# Patient Record
Sex: Male | Born: 1985 | Race: Black or African American | Hispanic: No | Smoking: Never smoker
Health system: Southern US, Community
[De-identification: ages and names within clinical notes are randomized; demographics above are authoritative.]

---
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
## 2008-10-29 ENCOUNTER — Emergency Department (HOSPITAL_COMMUNITY): Admission: EM | Admit: 2008-10-29 | Discharge: 2008-10-29 | Payer: Self-pay | Admitting: Emergency Medicine

## 2008-12-19 ENCOUNTER — Emergency Department (HOSPITAL_COMMUNITY): Admission: EM | Admit: 2008-12-19 | Discharge: 2008-12-19 | Payer: Self-pay | Admitting: Emergency Medicine

## 2010-07-08 LAB — GC/CHLAMYDIA PROBE AMP, GENITAL: GC Probe Amp, Genital: NEGATIVE

## 2010-07-26 IMAGING — CR DG ANKLE COMPLETE 3+V*L*
3 series · 3 of 3 positions shown · non-contrast
Comparison: None

CLINICAL DATA: Injured ankle playing basketball

LEFT ANKLE COMPLETE - 3+ VIEW

[view not recorded (1 of 3)]
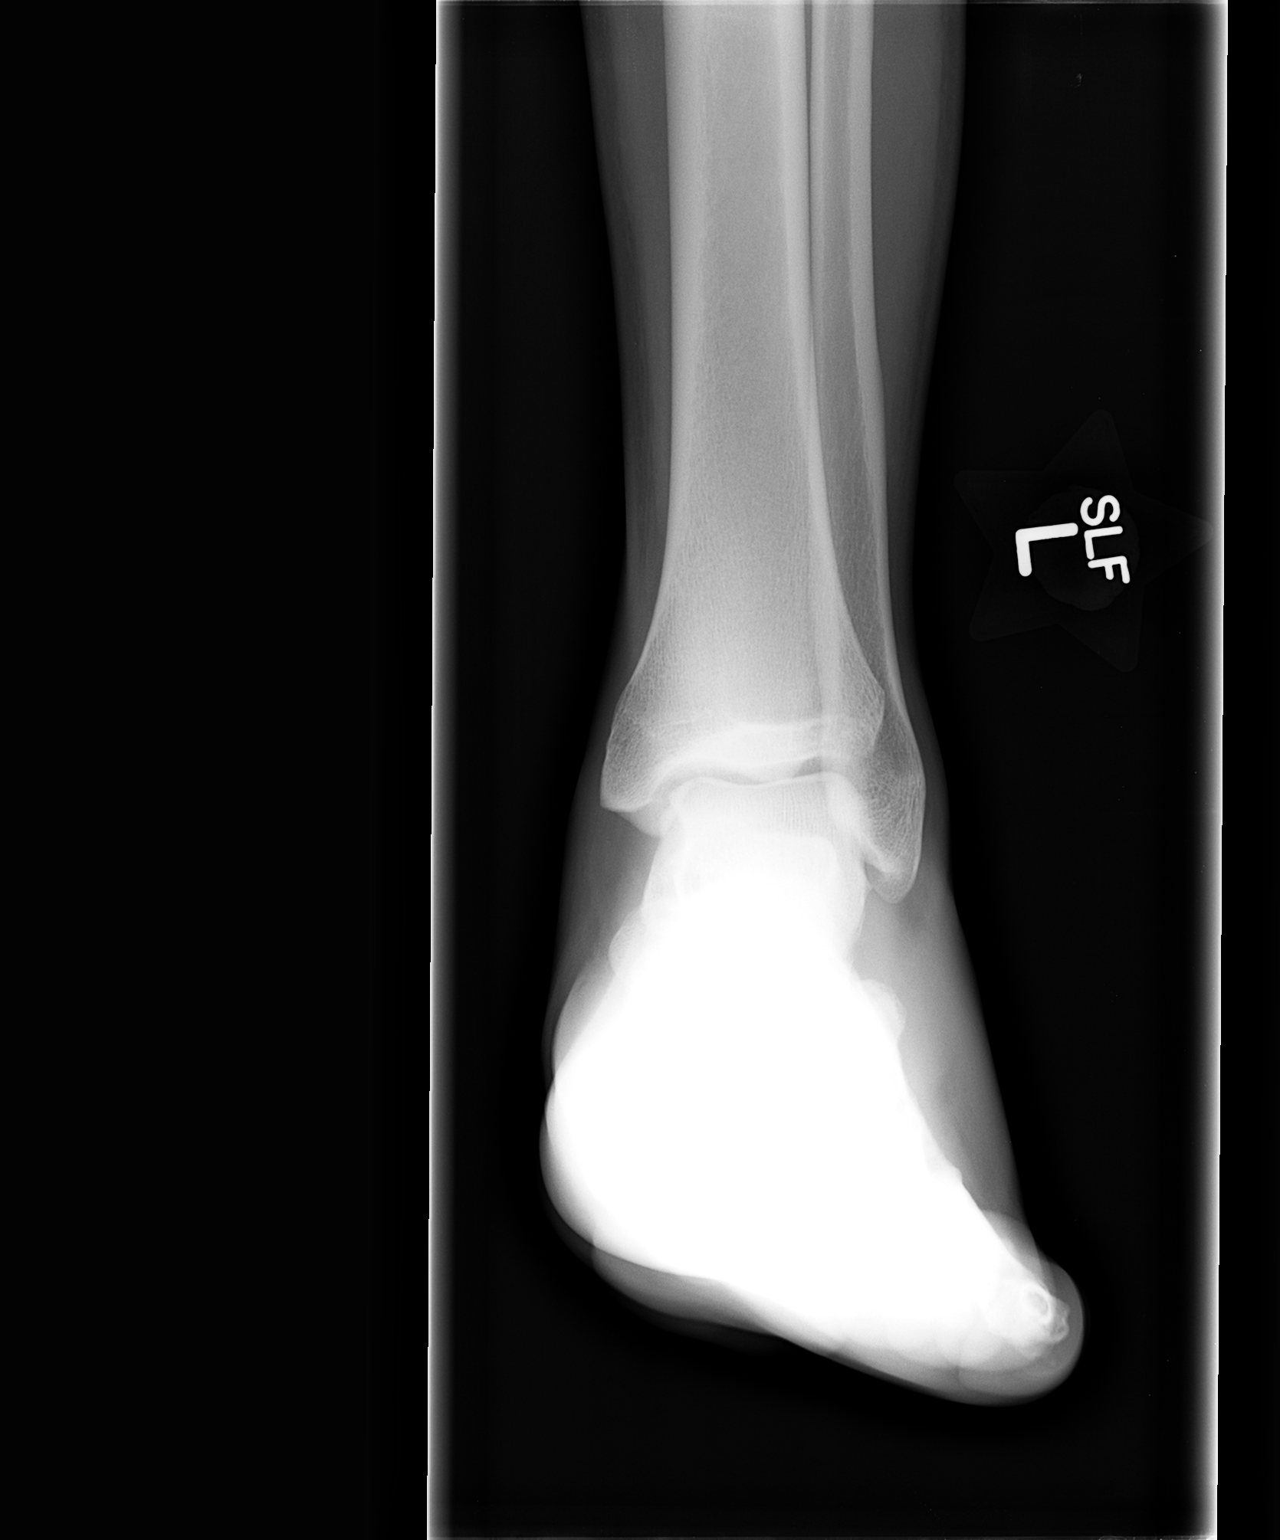

[view not recorded (2 of 3)]
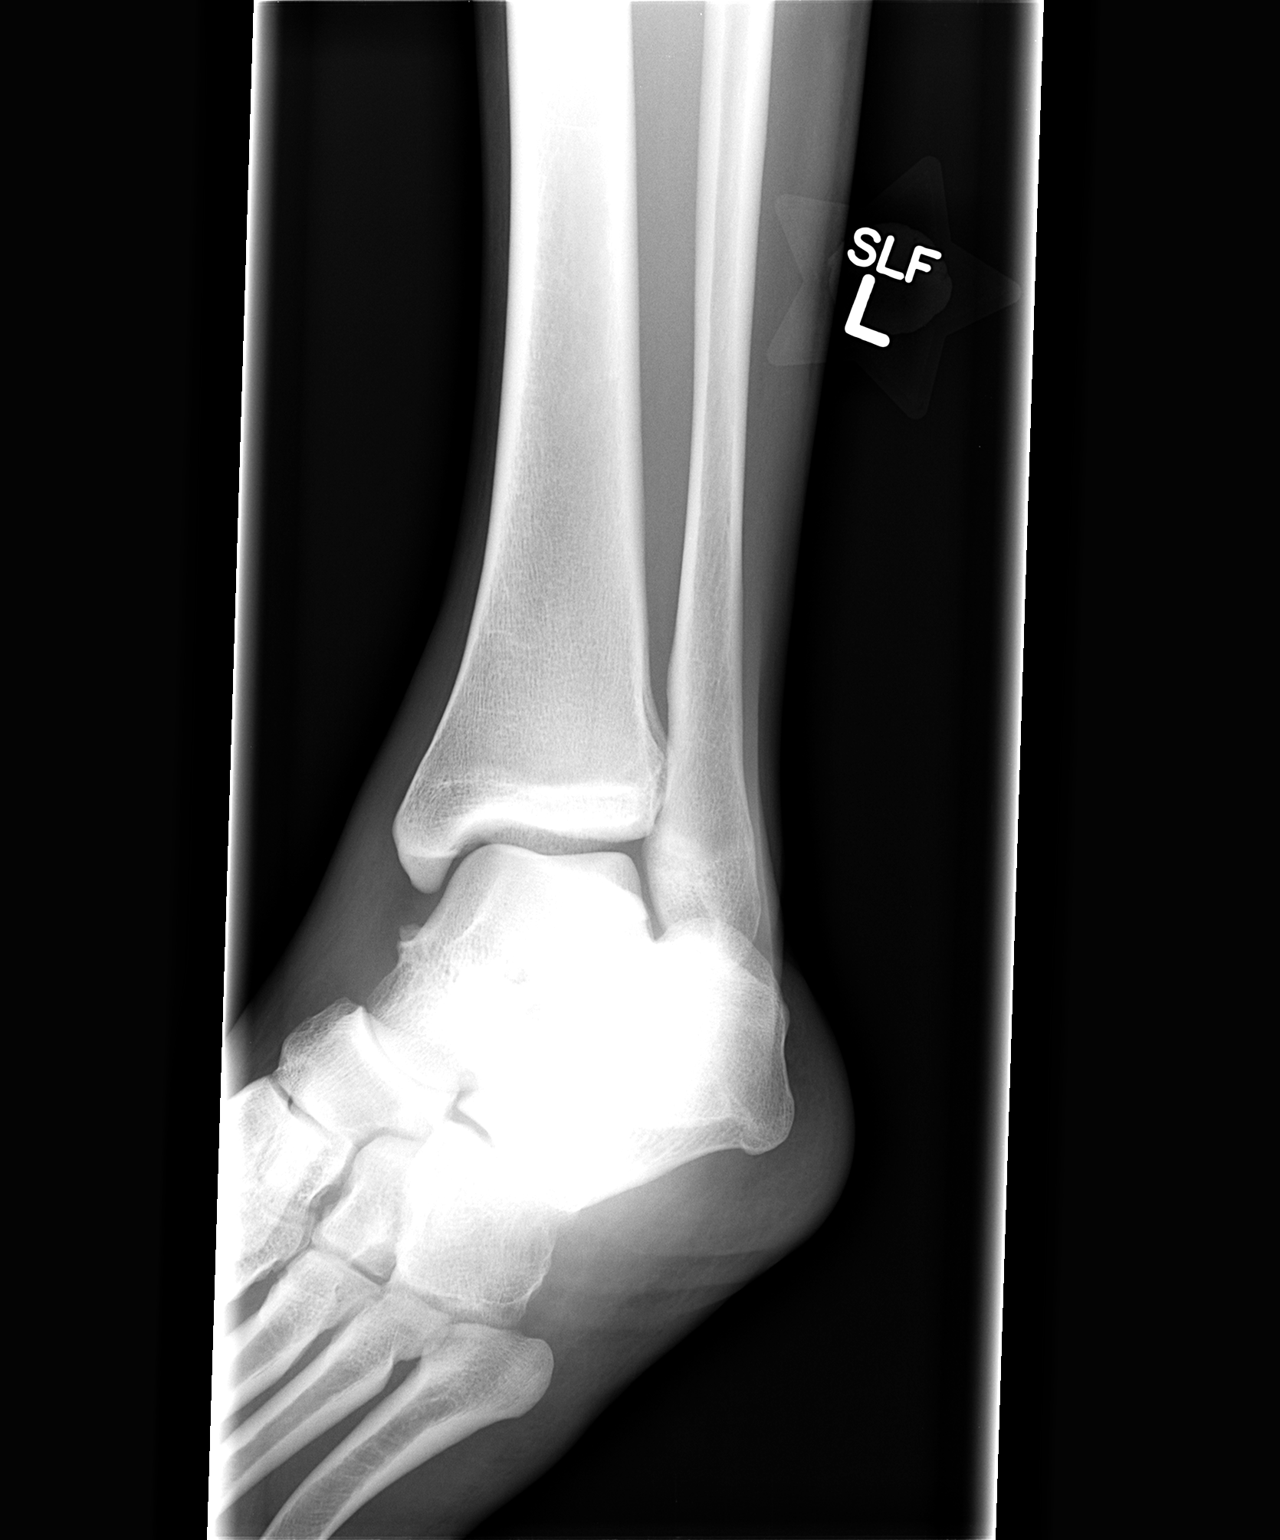

[view not recorded (3 of 3)]
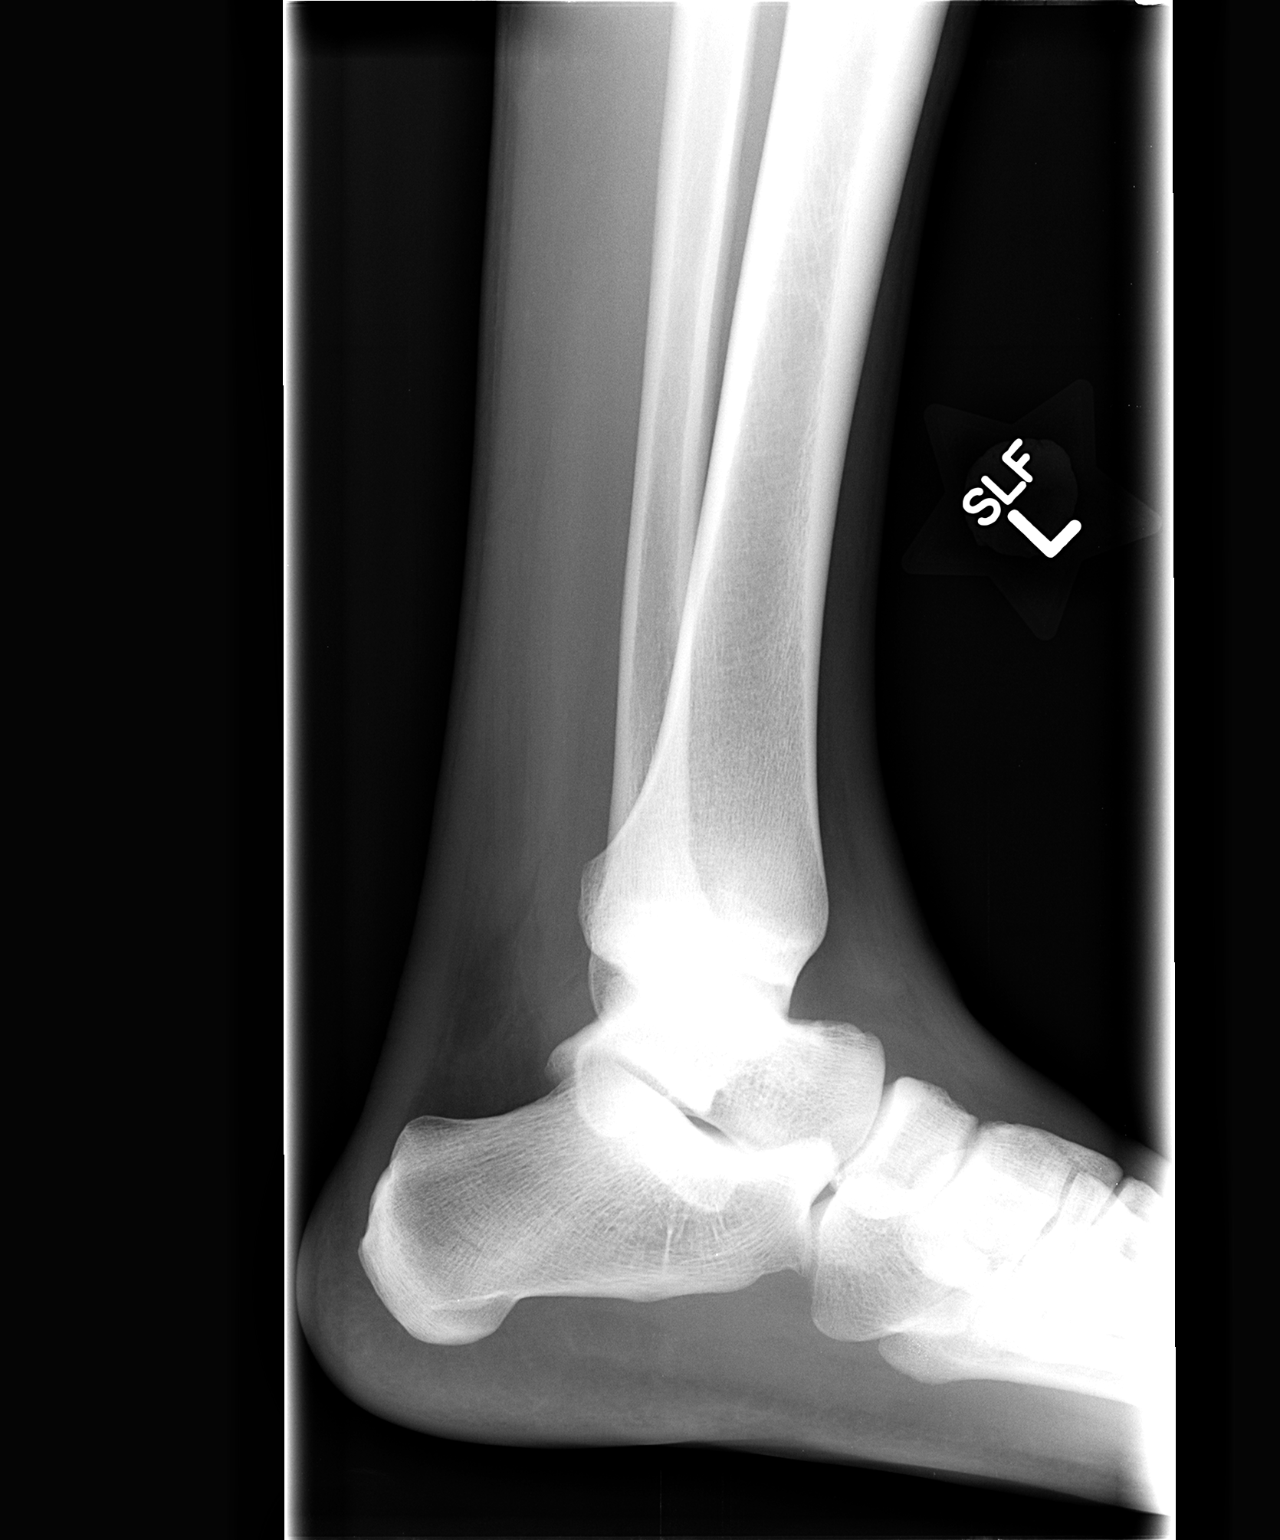

[3 of 3 positions shown; findings below may reference images not displayed]

FINDINGS: Ankle joint appears normal.  No acute fracture is seen.
Alignment is normal.
IMPRESSION: Negative left ankle.

## 2014-04-05 ENCOUNTER — Emergency Department (INDEPENDENT_AMBULATORY_CARE_PROVIDER_SITE_OTHER)
Admission: EM | Admit: 2014-04-05 | Discharge: 2014-04-05 | Disposition: A | Discharge status: Home / Self Care | Payer: Worker's Compensation | Source: Home / Self Care | Attending: Family Medicine | Admitting: Family Medicine

## 2014-04-05 ENCOUNTER — Encounter (HOSPITAL_COMMUNITY): Payer: Self-pay | Admitting: Emergency Medicine

## 2014-04-05 DIAGNOSIS — S4991XA Unspecified injury of right shoulder and upper arm, initial encounter: Secondary | ICD-10-CM

## 2014-04-05 MED ORDER — DICLOFENAC SODIUM 75 MG PO TBEC: 75.0000 mg | DELAYED_RELEASE_TABLET | Freq: Two times a day (BID) | ORAL | Status: DC | PRN

## 2014-04-05 NOTE — Discharge Instructions (Signed)
Thank you for coming in today. Follow-up at [facility] or orthopedics of choice for [employer]. Claims Take diclofenac twice daily as needed  Impingement Syndrome, Rotator Cuff, Bursitis with Rehab Impingement syndrome is a condition that involves inflammation of the tendons of the rotator cuff and the subacromial bursa, that causes pain in the shoulder. The rotator cuff consists of four tendons and muscles that control much of the shoulder and upper arm function. The subacromial bursa is a fluid filled sac that helps reduce friction between the rotator cuff and one of the bones of the shoulder (acromion). Impingement syndrome is usually an overuse injury that causes swelling of the bursa (bursitis), swelling of the tendon (tendonitis), and/or a tear of the tendon (strain). Strains are classified into three categories. Grade 1 strains cause pain, but the tendon is not lengthened. Grade 2 strains include a lengthened ligament, due to the ligament being stretched or partially ruptured. With grade 2 strains there is still function, although the function may be decreased. Grade 3 strains include a complete tear of the tendon or muscle, and function is usually impaired. SYMPTOMS   Pain around the shoulder, often at the outer portion of the upper arm.  Pain that gets worse with shoulder function, especially when reaching overhead or lifting.  Sometimes, aching when not using the arm.  Pain that wakes you up at night.  Sometimes, tenderness, swelling, warmth, or redness over the affected area.  Loss of strength.  Limited motion of the shoulder, especially reaching behind the back (to the back pocket or to unhook bra) or across your body.  Crackling sound (crepitation) when moving the arm.  Biceps tendon pain and inflammation (in the front of the shoulder). Worse when bending the elbow or lifting. CAUSES  Impingement syndrome is often an overuse injury, in which chronic  (repetitive) motions cause the tendons or bursa to become inflamed. A strain occurs when a force is paced on the tendon or muscle that is greater than it can withstand. Common mechanisms of injury include: Stress from sudden increase in duration, frequency, or intensity of training.  Direct hit (trauma) to the shoulder.  Aging, erosion of the tendon with normal use.  Bony bump on shoulder (acromial spur). RISK INCREASES WITH:  Contact sports (football, wrestling, boxing).  Throwing sports (baseball, tennis, volleyball).  Weightlifting and bodybuilding.  Heavy labor.  Previous injury to the rotator cuff, including impingement.  Poor shoulder strength and flexibility.  Failure to warm up properly before activity.  Inadequate protective equipment.  Old age.  Bony bump on shoulder (acromial spur). PREVENTION   Warm up and stretch properly before activity.  Allow for adequate recovery between workouts.  Maintain physical fitness:  Strength, flexibility, and endurance.  Cardiovascular fitness.  Learn and use proper exercise technique. PROGNOSIS  If treated properly, impingement syndrome usually goes away within 6 weeks. Sometimes surgery is required.  RELATED COMPLICATIONS   Longer healing time if not properly treated, or if not given enough time to heal.  Recurring symptoms, that result in a chronic condition.  Shoulder stiffness, frozen shoulder, or loss of motion.  Rotator cuff tendon tear.  Recurring symptoms, especially if activity is resumed too soon, with overuse, with a direct blow, or when using poor technique. TREATMENT  Treatment first involves the use of ice and medicine, to reduce pain and inflammation. The use of strengthening and stretching exercises may help reduce pain with activity. These exercises may be performed at home or with a  therapist. If non-surgical treatment is unsuccessful after more than 6 months, surgery may be advised. After surgery  and rehabilitation, activity is usually possible in 3 months.  MEDICATION  If pain medicine is needed, nonsteroidal anti-inflammatory medicines (aspirin and ibuprofen), or other minor pain relievers (acetaminophen), are often advised.  Do not take pain medicine for 7 days before surgery.  Prescription pain relievers may be given, if your caregiver thinks they are needed. Use only as directed and only as much as you need.  Corticosteroid injections may be given by your caregiver. These injections should be reserved for the most serious cases, because they may only be given a certain number of times. HEAT AND COLD  Cold treatment (icing) should be applied for 10 to 15 minutes every 2 to 3 hours for inflammation and pain, and immediately after activity that aggravates your symptoms. Use ice packs or an ice massage.  Heat treatment may be used before performing stretching and strengthening activities prescribed by your caregiver, physical therapist, or athletic trainer. Use a heat pack or a warm water soak. SEEK MEDICAL CARE IF:   Symptoms get worse or do not improve in 4 to 6 weeks, despite treatment.  New, unexplained symptoms develop. (Drugs used in treatment may produce side effects.) EXERCISES  RANGE OF MOTION (ROM) AND STRETCHING EXERCISES - Impingement Syndrome (Rotator Cuff  Tendinitis, Bursitis) These exercises may help you when beginning to rehabilitate your injury. Your symptoms may go away with or without further involvement from your physician, physical therapist or athletic trainer. While completing these exercises, remember:   Restoring tissue flexibility helps normal motion to return to the joints. This allows healthier, less painful movement and activity.  An effective stretch should be held for at least 30 seconds.  A stretch should never be painful. You should only feel a gentle lengthening or release in the stretched tissue. STRETCH - Flexion, Standing  Stand with good  posture. With an underhand grip on your right / left hand, and an overhand grip on the opposite hand, grasp a broomstick or cane so that your hands are a little more than shoulder width apart.  Keeping your right / left elbow straight and shoulder muscles relaxed, push the stick with your opposite hand, to raise your right / left arm in front of your body and then overhead. Raise your arm until you feel a stretch in your right / left shoulder, but before you have increased shoulder pain.  Try to avoid shrugging your right / left shoulder as your arm rises, by keeping your shoulder blade tucked down and toward your mid-back spine. Hold for __________ seconds.  Slowly return to the starting position. Repeat __________ times. Complete this exercise __________ times per day. STRETCH - Abduction, Supine  Lie on your back. With an underhand grip on your right / left hand and an overhand grip on the opposite hand, grasp a broomstick or cane so that your hands are a little more than shoulder width apart.  Keeping your right / left elbow straight and your shoulder muscles relaxed, push the stick with your opposite hand, to raise your right / left arm out to the side of your body and then overhead. Raise your arm until you feel a stretch in your right / left shoulder, but before you have increased shoulder pain.  Try to avoid shrugging your right / left shoulder as your arm rises, by keeping your shoulder blade tucked down and toward your mid-back spine. Hold for __________  seconds.  Slowly return to the starting position. Repeat __________ times. Complete this exercise __________ times per day. ROM - Flexion, Active-Assisted  Lie on your back. You may bend your knees for comfort.  Grasp a broomstick or cane so your hands are about shoulder width apart. Your right / left hand should grip the end of the stick, so that your hand is positioned "thumbs-up," as if you were about to shake hands.  Using your  healthy arm to lead, raise your right / left arm overhead, until you feel a gentle stretch in your shoulder. Hold for __________ seconds.  Use the stick to assist in returning your right / left arm to its starting position. Repeat __________ times. Complete this exercise __________ times per day.  ROM - Internal Rotation, Supine   Lie on your back on a firm surface. Place your right / left elbow about 60 degrees away from your side. Elevate your elbow with a folded towel, so that the elbow and shoulder are the same height.  Using a broomstick or cane and your strong arm, pull your right / left hand toward your body until you feel a gentle stretch, but no increase in your shoulder pain. Keep your shoulder and elbow in place throughout the exercise.  Hold for __________ seconds. Slowly return to the starting position. Repeat __________ times. Complete this exercise __________ times per day. STRETCH - Internal Rotation  Place your right / left hand behind your back, palm up.  Throw a towel or belt over your opposite shoulder. Grasp the towel with your right / left hand.  While keeping an upright posture, gently pull up on the towel, until you feel a stretch in the front of your right / left shoulder.  Avoid shrugging your right / left shoulder as your arm rises, by keeping your shoulder blade tucked down and toward your mid-back spine.  Hold for __________ seconds. Release the stretch, by lowering your healthy hand. Repeat __________ times. Complete this exercise __________ times per day. ROM - Internal Rotation   Using an underhand grip, grasp a stick behind your back with both hands.  While standing upright with good posture, slide the stick up your back until you feel a mild stretch in the front of your shoulder.  Hold for __________ seconds. Slowly return to your starting position. Repeat __________ times. Complete this exercise __________ times per day.  STRETCH - Posterior Shoulder  Capsule   Stand or sit with good posture. Grasp your right / left elbow and draw it across your chest, keeping it at the same height as your shoulder.  Pull your elbow, so your upper arm comes in closer to your chest. Pull until you feel a gentle stretch in the back of your shoulder.  Hold for __________ seconds. Repeat __________ times. Complete this exercise __________ times per day. STRENGTHENING EXERCISES - Impingement Syndrome (Rotator Cuff Tendinitis, Bursitis) These exercises may help you when beginning to rehabilitate your injury. They may resolve your symptoms with or without further involvement from your physician, physical therapist or athletic trainer. While completing these exercises, remember:  Muscles can gain both the endurance and the strength needed for everyday activities through controlled exercises.  Complete these exercises as instructed by your physician, physical therapist or athletic trainer. Increase the resistance and repetitions only as guided.  You may experience muscle soreness or fatigue, but the pain or discomfort you are trying to eliminate should never worsen during these exercises. If this pain does  get worse, stop and make sure you are following the directions exactly. If the pain is still present after adjustments, discontinue the exercise until you can discuss the trouble with your clinician.  During your recovery, avoid activity or exercises which involve actions that place your injured hand or elbow above your head or behind your back or head. These positions stress the tissues which you are trying to heal. STRENGTH - Scapular Depression and Adduction   With good posture, sit on a firm chair. Support your arms in front of you, with pillows, arm rests, or on a table top. Have your elbows in line with the sides of your body.  Gently draw your shoulder blades down and toward your mid-back spine. Gradually increase the tension, without tensing the muscles  along the top of your shoulders and the back of your neck.  Hold for __________ seconds. Slowly release the tension and relax your muscles completely before starting the next repetition.  After you have practiced this exercise, remove the arm support and complete the exercise in standing as well as sitting position. Repeat __________ times. Complete this exercise __________ times per day.  STRENGTH - Shoulder Abductors, Isometric  With good posture, stand or sit about 4-6 inches from a wall, with your right / left side facing the wall.  Bend your right / left elbow. Gently press your right / left elbow into the wall. Increase the pressure gradually, until you are pressing as hard as you can, without shrugging your shoulder or increasing any shoulder discomfort.  Hold for __________ seconds.  Release the tension slowly. Relax your shoulder muscles completely before you begin the next repetition. Repeat __________ times. Complete this exercise __________ times per day.  STRENGTH - External Rotators, Isometric  Keep your right / left elbow at your side and bend it 90 degrees.  Step into a door frame so that the outside of your right / left wrist can press against the door frame without your upper arm leaving your side.  Gently press your right / left wrist into the door frame, as if you were trying to swing the back of your hand away from your stomach. Gradually increase the tension, until you are pressing as hard as you can, without shrugging your shoulder or increasing any shoulder discomfort.  Hold for __________ seconds.  Release the tension slowly. Relax your shoulder muscles completely before you begin the next repetition. Repeat __________ times. Complete this exercise __________ times per day.  STRENGTH - Supraspinatus   Stand or sit with good posture. Grasp a __________ weight, or an exercise band or tubing, so that your hand is "thumbs-up," like you are shaking hands.  Slowly  lift your right / left arm in a "V" away from your thigh, diagonally into the space between your side and straight ahead. Lift your hand to shoulder height or as far as you can, without increasing any shoulder pain. At first, many people do not lift their hands above shoulder height.  Avoid shrugging your right / left shoulder as your arm rises, by keeping your shoulder blade tucked down and toward your mid-back spine.  Hold for __________ seconds. Control the descent of your hand, as you slowly return to your starting position. Repeat __________ times. Complete this exercise __________ times per day.  STRENGTH - External Rotators  Secure a rubber exercise band or tubing to a fixed object (table, pole) so that it is at the same height as your right / left elbow  when you are standing or sitting on a firm surface.  Stand or sit so that the secured exercise band is at your uninjured side.  Bend your right / left elbow 90 degrees. Place a folded towel or small pillow under your right / left arm, so that your elbow is a few inches away from your side.  Keeping the tension on the exercise band, pull it away from your body, as if pivoting on your elbow. Be sure to keep your body steady, so that the movement is coming only from your rotating shoulder.  Hold for __________ seconds. Release the tension in a controlled manner, as you return to the starting position. Repeat __________ times. Complete this exercise __________ times per day.  STRENGTH - Internal Rotators   Secure a rubber exercise band or tubing to a fixed object (table, pole) so that it is at the same height as your right / left elbow when you are standing or sitting on a firm surface.  Stand or sit so that the secured exercise band is at your right / left side.  Bend your elbow 90 degrees. Place a folded towel or small pillow under your right / left arm so that your elbow is a few inches away from your side.  Keeping the tension on the  exercise band, pull it across your body, toward your stomach. Be sure to keep your body steady, so that the movement is coming only from your rotating shoulder.  Hold for __________ seconds. Release the tension in a controlled manner, as you return to the starting position. Repeat __________ times. Complete this exercise __________ times per day.  STRENGTH - Scapular Protractors, Standing   Stand arms length away from a wall. Place your hands on the wall, keeping your elbows straight.  Begin by dropping your shoulder blades down and toward your mid-back spine.  To strengthen your protractors, keep your shoulder blades down, but slide them forward on your rib cage. It will feel as if you are lifting the back of your rib cage away from the wall. This is a subtle motion and can be challenging to complete. Ask your caregiver for further instruction, if you are not sure you are doing the exercise correctly.  Hold for __________ seconds. Slowly return to the starting position, resting the muscles completely before starting the next repetition. Repeat __________ times. Complete this exercise __________ times per day. STRENGTH - Scapular Protractors, Supine  Lie on your back on a firm surface. Extend your right / left arm straight into the air while holding a __________ weight in your hand.  Keeping your head and back in place, lift your shoulder off the floor.  Hold for __________ seconds. Slowly return to the starting position, and allow your muscles to relax completely before starting the next repetition. Repeat __________ times. Complete this exercise __________ times per day. STRENGTH - Scapular Protractors, Quadruped  Get onto your hands and knees, with your shoulders directly over your hands (or as close as you can be, comfortably).  Keeping your elbows locked, lift the back of your rib cage up into your shoulder blades, so your mid-back rounds out. Keep your neck muscles relaxed.  Hold  this position for __________ seconds. Slowly return to the starting position and allow your muscles to relax completely before starting the next repetition. Repeat __________ times. Complete this exercise __________ times per day.  STRENGTH - Scapular Retractors  Secure a rubber exercise band or tubing to a fixed object (  table, pole), so that it is at the height of your shoulders when you are either standing, or sitting on a firm armless chair.  With a palm down grip, grasp an end of the band in each hand. Straighten your elbows and lift your hands straight in front of you, at shoulder height. Step back, away from the secured end of the band, until it becomes tense.  Squeezing your shoulder blades together, draw your elbows back toward your sides, as you bend them. Keep your upper arms lifted away from your body throughout the exercise.  Hold for __________ seconds. Slowly ease the tension on the band, as you reverse the directions and return to the starting position. Repeat __________ times. Complete this exercise __________ times per day. STRENGTH - Shoulder Extensors   Secure a rubber exercise band or tubing to a fixed object (table, pole) so that it is at the height of your shoulders when you are either standing, or sitting on a firm armless chair.  With a thumbs-up grip, grasp an end of the band in each hand. Straighten your elbows and lift your hands straight in front of you, at shoulder height. Step back, away from the secured end of the band, until it becomes tense.  Squeezing your shoulder blades together, pull your hands down to the sides of your thighs. Do not allow your hands to go behind you.  Hold for __________ seconds. Slowly ease the tension on the band, as you reverse the directions and return to the starting position. Repeat __________ times. Complete this exercise __________ times per day.  STRENGTH - Scapular Retractors and External Rotators   Secure a rubber exercise  band or tubing to a fixed object (table, pole) so that it is at the height as your shoulders, when you are either standing, or sitting on a firm armless chair.  With a palm down grip, grasp an end of the band in each hand. Bend your elbows 90 degrees and lift your elbows to shoulder height, at your sides. Step back, away from the secured end of the band, until it becomes tense.  Squeezing your shoulder blades together, rotate your shoulders so that your upper arms and elbows remain stationary, but your fists travel upward to head height.  Hold for __________ seconds. Slowly ease the tension on the band, as you reverse the directions and return to the starting position. Repeat __________ times. Complete this exercise __________ times per day.  STRENGTH - Scapular Retractors and External Rotators, Rowing   Secure a rubber exercise band or tubing to a fixed object (table, pole) so that it is at the height of your shoulders, when you are either standing, or sitting on a firm armless chair.  With a palm down grip, grasp an end of the band in each hand. Straighten your elbows and lift your hands straight in front of you, at shoulder height. Step back, away from the secured end of the band, until it becomes tense.  Step 1: Squeeze your shoulder blades together. Bending your elbows, draw your hands to your chest, as if you are rowing a boat. At the end of this motion, your hands and elbow should be at shoulder height and your elbows should be out to your sides.  Step 2: Rotate your shoulders, to raise your hands above your head. Your forearms should be vertical and your upper arms should be horizontal.  Hold for __________ seconds. Slowly ease the tension on the band, as you reverse the  directions and return to the starting position. Repeat __________ times. Complete this exercise __________ times per day.  STRENGTH - Scapular Depressors  Find a sturdy chair without wheels, such as a dining room  chair.  Keeping your feet on the floor, and your hands on the chair arms, lift your bottom up from the seat, and lock your elbows.  Keeping your elbows straight, allow gravity to pull your body weight down. Your shoulders will rise toward your ears.  Raise your body against gravity by drawing your shoulder blades down your back, shortening the distance between your shoulders and ears. Although your feet should always maintain contact with the floor, your feet should progressively support less body weight, as you get stronger.  Hold for __________ seconds. In a controlled and slow manner, lower your body weight to begin the next repetition. Repeat __________ times. Complete this exercise __________ times per day.  Document Released: 03/20/2005 Document Revised: 06/12/2011 Document Reviewed: 07/02/2008 [facility] Patient Information 2015 [place], [place]. This information is not intended to replace advice given to you by your health care provider. Make sure you discuss any questions you have with your health care provider.

## 2014-04-05 NOTE — ED Provider Notes (Signed)
Jeffrey Parker is a 29 y.o. VYRON FRONCZAKho presents to Urgent Care today for right shoulder pain. He shouldn't developed right shoulder pain at work at [employer] 1-1/2-2 weeks ago. He felt a pulling shifting sensation in his shoulder while retrieving a heavy box. Since that his shoulder has felt somewhat unstable and painful. He denies any radiating pain weakness or numbness or fevers or chills. He's tried some over-the-counter medications which have not helped much.   History reviewed. No pertinent past medical history. History reviewed. No pertinent past surgical history. History  Substance Use Topics  . Smoking status: Never Smoker   . Smokeless tobacco: Not on file  . Alcohol Use: No   ROS as above Medications: No current facility-administered medications for this encounter.   Current Outpatient Prescriptions  Medication Sig Dispense Refill  . diclofenac (VOLTAREN) 75 MG EC tablet Take 1 tablet (75 mg total) by mouth 2 (two) times daily as needed. 60 tablet 0   No Known Allergies   Exam:  BP 121/67 mmHg  Pulse 55  Temp(Src) 97.7 F (36.5 C) (Oral)  Resp 12  SpO2 100% Gen: Well NAD Neck: Tender to spinal midline normal neck range of motion. Right shoulder normal-appearing nontender. Normal range of motion Negative impingement testing Positive O'Brien test. Positive apprehension and relocation test Strength is intact throughout left shoulder nontender normal motion negative impingement normal strength Pulses capillary Refill sensation are intact bilateral upper extremities  No results found for this or any previous visit (from the past 24 hour(s)). No results found.  Assessment and Plan: 29 y.o. male with suspect labrum injury. Treatment with diclofenac relative rest home exercise program and follow-up with orthopedics  Discussed warning signs or symptoms. Please see discharge instructions. Patient expresses understanding.     Rodolph Bong, MD 04/05/14 [phone]

## 2014-04-05 NOTE — ED Notes (Signed)
Patient c/o shoulder painx 2 weeks. Patient reports he does a lot of heavy lifting at work. Reports pain feels worse after sleeping and gradually gets better during the day. Patient is in NAD.

## 2019-01-01 ENCOUNTER — Other Ambulatory Visit: Payer: Self-pay

## 2019-01-01 ENCOUNTER — Ambulatory Visit (HOSPITAL_COMMUNITY)
Admission: EM | Admit: 2019-01-01 | Discharge: 2019-01-01 | Disposition: A | Discharge status: Home / Self Care | Payer: HRSA Program | Attending: Family Medicine | Admitting: Family Medicine

## 2019-01-01 ENCOUNTER — Encounter (HOSPITAL_COMMUNITY): Payer: Self-pay | Admitting: Emergency Medicine

## 2019-01-01 DIAGNOSIS — Z0189 Encounter for other specified special examinations: Secondary | ICD-10-CM | POA: Diagnosis present

## 2019-01-01 DIAGNOSIS — Z1159 Encounter for screening for other viral diseases: Secondary | ICD-10-CM

## 2019-01-01 DIAGNOSIS — U071 COVID-19: Secondary | ICD-10-CM | POA: Diagnosis not present

## 2019-01-01 NOTE — Discharge Instructions (Addendum)
If your Covid-19 test is positive, you will get a phone call from [facility] regarding your results. If your Covid-19 test is negative, you will NOT get a phone call from [facility] with your results. You may view your results on MyChart. If you do not have a MyChart account, sign up instructions are in your discharge papers. ° °

## 2019-01-01 NOTE — ED Triage Notes (Signed)
Pt here for Covid Test.  Pt has had no known exposure and no symptoms.  Pt just wants to be tested.

## 2019-01-02 NOTE — ED Provider Notes (Signed)
  [facility]   [id number] 01/01/19 Arrival Time: 4967  ASSESSMENT & PLAN:  1. Patient request for diagnostic testing     COVID-19 testing sent. Asymptomatic.  Follow-up Information    [facility].   Specialty: Urgent Care Why: As needed. Contact information: [place] [phone]          Reviewed expectations re: course of current medical issues. Questions answered. Outlined signs and symptoms indicating need for more acute intervention. Patient verbalized understanding. After Visit Summary given.   SUBJECTIVE: History from: patient. Jeffrey Parker is a 33 y.o. male who requests COVID-19 testing. Known COVID-19 contact: none. Recent travel: none. Denies: runny nose, congestion, fever, cough, sore throat, difficulty breathing and headache.  ROS: As per HPI.   OBJECTIVE:  Vitals:   01/01/19 1544  BP: 120/83  Pulse: 77  Resp: 12  Temp: 98.5 F (36.9 C)  TempSrc: Oral  SpO2: 100%    General appearance: alert; no distress Eyes: PERRLA; EOMI; conjunctiva normal HENT: [place]; AT; nasal mucosa normal; oral mucosa normal Neck: supple  Lungs: clear to auscultation bilaterally; unlabored Heart: regular rate and rhythm Abdomen: soft, non-tender Extremities: no edema Skin: warm and dry Neurologic: normal gait Psychological: alert and cooperative; normal mood and affect  Labs: Labs Reviewed  NOVEL CORONAVIRUS, NAA (HOSP ORDER, SEND-OUT TO REF LAB; TAT 18-24 HRS)    No Known Allergies  Social History   Socioeconomic History  . Marital status: Single    Spouse name: Not on file  . Number of children: Not on file  . Years of education: Not on file  . Highest education level: Not on file  Occupational History  . Not on file  Social Needs  . Financial resource strain: Not on file  . Food insecurity    Worry: Not on file    Inability: Not on file  . Transportation  needs    Medical: Not on file    Non-medical: Not on file  Tobacco Use  . Smoking status: Never Smoker  . Smokeless tobacco: Never Used  Substance and Sexual Activity  . Alcohol use: No  . Drug use: No  . Sexual activity: Not on file  Lifestyle  . Physical activity    Days per week: Not on file    Minutes per session: Not on file  . Stress: Not on file  Relationships  . Social Herbalist on phone: Not on file    Gets together: Not on file    Attends religious service: Not on file    Active member of club or organization: Not on file    Attends meetings of clubs or organizations: Not on file    Relationship status: Not on file  . Intimate partner violence    Fear of current or ex partner: Not on file    Emotionally abused: Not on file    Physically abused: Not on file    Forced sexual activity: Not on file  Other Topics Concern  . Not on file  Social History Narrative  . Not on file   FH: HTN  No past surgical history on file.   Vanessa Kick, MD 01/02/19 [phone]

## 2019-01-03 ENCOUNTER — Telehealth (HOSPITAL_COMMUNITY): Payer: Self-pay | Admitting: Emergency Medicine

## 2019-01-03 LAB — NOVEL CORONAVIRUS, NAA (HOSP ORDER, SEND-OUT TO REF LAB; TAT 18-24 HRS): SARS-CoV-2, NAA: DETECTED — AB

## 2019-01-03 NOTE — Telephone Encounter (Signed)
Positive covid, pt denies symptoms. Quarantine time will be from day of test. Patient contacted and made aware of    results, all questions answered  Gave Mychart info for sign up to send doctors notes.

## 2019-01-22 ENCOUNTER — Ambulatory Visit (HOSPITAL_COMMUNITY)
Admission: EM | Admit: 2019-01-22 | Discharge: 2019-01-22 | Disposition: A | Discharge status: Home / Self Care | Payer: Self-pay

## 2019-01-22 NOTE — Discharge Instructions (Signed)
According to the CDC there is no need for retesting.  After 10 days of symptoms and fever free for at least 3 consecutive days it is safe for you to return to work No need for retesting.

## 2019-02-12 ENCOUNTER — Encounter (HOSPITAL_COMMUNITY): Payer: Self-pay

## 2019-02-12 ENCOUNTER — Ambulatory Visit (HOSPITAL_COMMUNITY)
Admission: EM | Admit: 2019-02-12 | Discharge: 2019-02-12 | Disposition: A | Discharge status: Home / Self Care | Payer: Self-pay | Attending: Family Medicine | Admitting: Family Medicine

## 2019-02-12 ENCOUNTER — Other Ambulatory Visit: Payer: Self-pay

## 2019-02-12 DIAGNOSIS — U071 COVID-19: Secondary | ICD-10-CM

## 2019-02-12 NOTE — ED Triage Notes (Signed)
Patient presents to Urgent Care with complaints of needing another covid test for work and a note stating he is okay to return since testing positive for covid in september. Patient reports he has been symptom free for a month and his job has been giving him a hard time about being able to come back to work without additional testing.

## 2019-02-12 NOTE — ED Provider Notes (Signed)
[facility]   [id number] 02/12/19 Arrival Time: 1226  ASSESSMENT & PLAN:  1. COVID-19   Resolved.  Letter given to employer stating that repeat testing within 3 months of testing positive is NOT recommended. He is cleared to return to work without restrictions. No current symptoms.   Follow-up Information    [facility].   Specialty: Urgent Care Why: As needed. Contact information: [address] [place] [address] [phone]          Reviewed expectations re: course of current medical issues. Questions answered. Outlined signs and symptoms indicating need for more acute intervention. Patient verbalized understanding. After Visit Summary given.   SUBJECTIVE: History from: patient. Jeffrey Parker is a 33 y.o. male who is here for f/u after testing positive for COVID-19 in 12/2018. No symptoms. Known COVID-19 contact: none currently. Recent travel: none. Denies: runny nose, congestion, fever, cough, sore throat, difficulty breathing and headache. Needs a note to return to work.  ROS: As per HPI.   OBJECTIVE:  Vitals:   02/12/19 1313  BP: 135/78  Pulse: 77  Resp: 16  TempSrc: Oral  SpO2: 100%    General appearance: alert; no distress Eyes: PERRLA; EOMI; conjunctiva normal HENT: [place]; AT; nasal mucosa normal; oral mucosa normal Neck: supple  Lungs: clear to auscultation bilaterally; unlabored Heart: regular rate and rhythm Abdomen: soft, non-tender Extremities: no edema Skin: warm and dry Neurologic: normal gait Psychological: alert and cooperative; normal mood and affect  Labs: Results for orders placed or performed during the hospital encounter of 01/01/19  Novel Coronavirus, NAA (Hosp order, Send-out to Ref Lab; TAT 18-24 hrs   Specimen: Nasopharyngeal Swab; Respiratory  Result Value Ref Range   SARS-CoV-2, NAA DETECTED (A) NOT DETECTED   Coronavirus Source NASOPHARYNGEAL      No  Known Allergies   Social History   Socioeconomic History  . Marital status: Single    Spouse name: Not on file  . Number of children: Not on file  . Years of education: Not on file  . Highest education level: Not on file  Occupational History  . Not on file  Social Needs  . Financial resource strain: Not on file  . Food insecurity    Worry: Not on file    Inability: Not on file  . Transportation needs    Medical: Not on file    Non-medical: Not on file  Tobacco Use  . Smoking status: Never Smoker  . Smokeless tobacco: Never Used  Substance and Sexual Activity  . Alcohol use: No  . Drug use: No  . Sexual activity: Not on file  Lifestyle  . Physical activity    Days per week: Not on file    Minutes per session: Not on file  . Stress: Not on file  Relationships  . Social Musician on phone: Not on file    Gets together: Not on file    Attends religious service: Not on file    Active member of club or organization: Not on file    Attends meetings of clubs or organizations: Not on file    Relationship status: Not on file  . Intimate partner violence    Fear of current or ex partner: Not on file    Emotionally abused: Not on file    Physically abused: Not on file    Forced sexual activity: Not on file  Other Topics Concern  . Not  on file  Social History Narrative  . Not on file   Family History  Problem Relation Age of Onset  . Healthy Mother   . Healthy Father    History reviewed. No pertinent surgical history.   Vanessa Kick, MD 02/12/19 [phone]

## 2019-05-01 ENCOUNTER — Encounter (HOSPITAL_COMMUNITY): Payer: Self-pay | Admitting: Emergency Medicine

## 2019-05-01 ENCOUNTER — Other Ambulatory Visit: Payer: Self-pay

## 2019-05-01 ENCOUNTER — Ambulatory Visit (HOSPITAL_COMMUNITY)
Admission: EM | Admit: 2019-05-01 | Discharge: 2019-05-01 | Disposition: A | Discharge status: Home / Self Care | Payer: Self-pay | Attending: Family Medicine | Admitting: Family Medicine

## 2019-05-01 DIAGNOSIS — G43009 Migraine without aura, not intractable, without status migrainosus: Secondary | ICD-10-CM

## 2019-05-01 MED ORDER — SUMATRIPTAN SUCCINATE 50 MG PO TABS: 50.0000 mg | ORAL_TABLET | ORAL | 0 refills | Status: DC | PRN

## 2019-05-01 NOTE — ED Triage Notes (Signed)
Pt here for right sided HA x 3 days

## 2019-05-01 NOTE — ED Provider Notes (Signed)
[facility]    CSN: [id number] Arrival date & time: 05/01/19  1720      History   Chief Complaint Chief Complaint  Patient presents with  . Headache    HPI Jeffrey Parker is a 34 y.o. male.   Patient has had headache since 2003.  Typically is preceded by a visual aura and then is one-sided.  He does not typically have nausea or vomiting with a headache.  Only trigger he has identified his not drinking enough water.  For the past 3 days he has had typical headache but this is atypical in that it has occurred 3 days in a row now.  HPI  History reviewed. No pertinent past medical history.  There are no problems to display for this patient.   History reviewed. No pertinent surgical history.     Home Medications    Prior to Admission medications   Not on File    Family History Family History  Problem Relation Age of Onset  . Healthy Mother   . Healthy Father     Social History Social History   Tobacco Use  . Smoking status: Never Smoker  . Smokeless tobacco: Never Used  Substance Use Topics  . Alcohol use: No  . Drug use: No     Allergies   Patient has no known allergies.   Review of Systems Review of Systems  Neurological: Positive for headaches.  All other systems reviewed and are negative.    Physical Exam Triage Vital Signs ED Triage Vitals  Enc Vitals Group     BP 05/01/19 1743 133/81     Pulse Rate 05/01/19 1743 (!) 56     Resp 05/01/19 1743 18     Temp 05/01/19 1743 98.7 F (37.1 C)     Temp Source 05/01/19 1743 Oral     SpO2 05/01/19 1743 100 %     Weight --      Height --      Head Circumference --      Peak Flow --      Pain Score 05/01/19 1744 6     Pain Loc --      Pain Edu? --      Excl. in GC? --    No data found.  Updated Vital Signs BP 133/81 (BP Location: Right Arm)   Pulse (!) 56   Temp 98.7 F (37.1 C) (Oral)   Resp 18   SpO2 100%   Visual Acuity Right Eye Distance:   Left Eye Distance:     Bilateral Distance:    Right Eye Near:   Left Eye Near:    Bilateral Near:     Physical Exam Vitals and nursing note reviewed.  Constitutional:      Appearance: He is well-developed and normal weight.  HENT:     Head: Normocephalic.  Eyes:     Extraocular Movements: Extraocular movements intact.     Pupils: Pupils are equal, round, and reactive to light.  Neurological:     Mental Status: He is alert and oriented to person, place, and time.     Sensory: No sensory deficit.     Motor: No weakness.     Coordination: Romberg sign negative.     Gait: Gait normal.     Deep Tendon Reflexes: Reflexes normal.  Psychiatric:        Mood and Affect: Mood normal.        Behavior: Behavior normal.  UC Treatments / Results  Labs (all labs ordered are listed, but only abnormal results are displayed) Labs Reviewed - No data to display  EKG   Radiology No results found.  Procedures Procedures (including critical care time)  Medications Ordered in UC Medications - No data to display  Initial Impression / Assessment and Plan / UC Course  I have reviewed the triage vital signs and the nursing notes.  Pertinent labs & imaging results that were available during my care of the patient were reviewed by me and considered in my medical decision making (see chart for details).     Classic migraine headache.  Will give him a trial of Imitrex to take it first symptom of headache such as with the visual aura Final Clinical Impressions(s) / UC Diagnoses   Final diagnoses:  None   Discharge Instructions   None    ED Prescriptions    None     PDMP not reviewed this encounter.   Wardell Honour, MD 05/01/19 [phone]

## 2019-06-23 ENCOUNTER — Other Ambulatory Visit: Payer: Self-pay

## 2019-06-23 ENCOUNTER — Ambulatory Visit (HOSPITAL_COMMUNITY)
Admission: EM | Admit: 2019-06-23 | Discharge: 2019-06-23 | Disposition: A | Discharge status: Home / Self Care | Payer: BC Managed Care – PPO | Attending: Family Medicine | Admitting: Family Medicine

## 2019-06-23 ENCOUNTER — Encounter (HOSPITAL_COMMUNITY): Payer: Self-pay | Admitting: Emergency Medicine

## 2019-06-23 DIAGNOSIS — M940 Chondrocostal junction syndrome [Tietze]: Secondary | ICD-10-CM

## 2019-06-23 NOTE — Discharge Instructions (Addendum)
If not allergic, you may use over the counter ibuprofen or acetaminophen as needed. ° °

## 2019-06-23 NOTE — ED Triage Notes (Signed)
Pt sts left sided rib pain x years when he presses the area; pt denies obvious injury

## 2019-06-24 NOTE — ED Provider Notes (Signed)
[facility]   [id number] 06/23/19 Arrival Time: 9629  ASSESSMENT & PLAN:  1. Costochondritis     No indication for imaging at this time. Reassured that he appears very healthy with a normal exam. See AVS for written information on costochondritis.  Has ibuprofen to use as needed.   Recommend: Follow-up Information    [facility].   Specialty: Urgent Care Why: If worsening or failing to improve as anticipated. Contact information: [place] [phone]          Reviewed expectations re: course of current medical issues. Questions answered. Outlined signs and symptoms indicating need for more acute intervention. Patient verbalized understanding. After Visit Summary given.  SUBJECTIVE: History from: patient. Jeffrey Parker is a 34 y.o. male who reports long-standing on/off "rib soreness" without past injury. 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 Describes "a sore feeling" of L anterior lower ribs that comes/goes sporadically. Does not limit him when present. Unable to say how long pain lasts. "Just got worried when I searched my symptoms on Google; wanted to come in and be checked out". No other chest pain. No associated SOB. No street drug use. Non-smoker. No OTC treatment reported.   OBJECTIVE:  Vitals:   06/23/19 1857  BP: 131/61  Pulse: (!) 58  Resp: 16  Temp: 98.6 F (37 C)  TempSrc: Oral  SpO2: 100%    General appearance: alert; no distress HEENT: [place]; AT Neck: supple with FROM Resp: unlabored respirations; CTAB CV: RRR Chest wall: very mild and poorly localized tenderness over L lower ribs Extremities: moves all extremities normally Skin: warm and dry; no visible rashes Neurologic: gait normal Psychological: alert and cooperative; normal mood and affect   No Known Allergies  History reviewed. No pertinent past medical history. Social  History   Socioeconomic History  . Marital status: Single    Spouse name: Not on file  . Number of children: Not on file  . Years of education: Not on file  . Highest education level: Not on file  Occupational History  . Not on file  Tobacco Use  . Smoking status: Never Smoker  . Smokeless tobacco: Never Used  Substance and Sexual Activity  . Alcohol use: No  . Drug use: No  . Sexual activity: Not on file  Other Topics Concern  . Not on file  Social History Narrative  . Not on file   Social Determinants of Health   Financial Resource Strain:   . Difficulty of Paying Living Expenses:   Food Insecurity:   . Worried About Charity fundraiser in the Last Year:   . Arboriculturist in the Last Year:   Transportation Needs:   . Film/video editor (Medical):   Marland Kitchen Lack of Transportation (Non-Medical):   Physical Activity:   . Days of Exercise per Week:   . Minutes of Exercise per Session:   Stress:   . Feeling of Stress :   Social Connections:   . Frequency of Communication with Friends and Family:   . Frequency of Social Gatherings with Friends and Family:   . Attends Religious Services:   . Active Member of Clubs or Organizations:   . Attends Archivist Meetings:   Marland Kitchen Marital Status:    Family History  Problem Relation Age of Onset  . Healthy Mother   . Healthy Father    History reviewed. No  pertinent surgical history.    Mardella Layman, MD 06/24/19 1014

## 2019-11-17 ENCOUNTER — Other Ambulatory Visit: Payer: Self-pay

## 2019-11-17 ENCOUNTER — Other Ambulatory Visit: Payer: Self-pay | Admitting: Registered Nurse

## 2019-11-17 ENCOUNTER — Telehealth (INDEPENDENT_AMBULATORY_CARE_PROVIDER_SITE_OTHER): Payer: BC Managed Care – PPO | Admitting: Registered Nurse

## 2019-11-17 ENCOUNTER — Encounter: Payer: Self-pay | Admitting: Registered Nurse

## 2019-11-17 VITALS — Ht 77.0 in | Wt 193.0 lb

## 2019-11-17 DIAGNOSIS — Z130 Encounter for screening for diseases of the blood and blood-forming organs and certain disorders involving the immune mechanism: Secondary | ICD-10-CM

## 2019-11-17 DIAGNOSIS — M940 Chondrocostal junction syndrome [Tietze]: Secondary | ICD-10-CM

## 2019-11-17 DIAGNOSIS — Z13220 Encounter for screening for lipoid disorders: Secondary | ICD-10-CM

## 2019-11-17 DIAGNOSIS — Z7689 Persons encountering health services in other specified circumstances: Secondary | ICD-10-CM

## 2019-11-17 DIAGNOSIS — Z1159 Encounter for screening for other viral diseases: Secondary | ICD-10-CM

## 2019-11-17 DIAGNOSIS — Z1329 Encounter for screening for other suspected endocrine disorder: Secondary | ICD-10-CM

## 2019-11-17 MED ORDER — DICLOFENAC SODIUM 75 MG PO TBEC: 75.0000 mg | DELAYED_RELEASE_TABLET | Freq: Two times a day (BID) | ORAL | 0 refills | Status: DC

## 2019-11-17 NOTE — Progress Notes (Signed)
Telemedicine Encounter- SOAP NOTE Established Patient  This telephone encounter was conducted with the patient's (or proxy's) verbal consent via audio telecommunications: yes  Patient was instructed to have this encounter in a suitably private space; and to only have persons present to whom they give permission to participate. In addition, patient identity was confirmed by use of name plus two identifiers (DOB and address).  I discussed the limitations, risks, security and privacy concerns of performing an evaluation and management service by telephone and the availability of in person appointments. I also discussed with the patient that there may be a patient responsible charge related to this service. The patient expressed understanding and agreed to proceed.  I spent a total of 18 minutes talking with the patient or their proxy.  Pt at home  Provider at home  Chief Complaint  Patient presents with  . Establish Care    pain in ribs going on for 5-7 months on right side and 1-2 on the left side     Subjective   Jeffrey Parker is a 34 y.o. established patient. Telephone visit today for visit to establish care and rib pain  HPI Rib pain - started on right side around 6 months ago. Seen in Urgent Care - told it is likely costochondritis, suggested ibuprofen, activity management, and primary care follow up However, now left side has been hurting as well. R side not improved.  Pain is worse at rest - not present or able to ignore it when moving or active. Does not wake him up at night. Feels like it is towards the bottom of his ribs. Denies chest pain, shob, doe, palpitations, headaches, nvd, abd pain, constipation, diarrhea, hx of GERD.  Pain is achy in nature. Pain is mildly lessened by ibuprofen.  There are no problems to display for this patient.   History reviewed. No pertinent past medical history.  Current Outpatient Medications  Medication Sig Dispense Refill  .  diclofenac (VOLTAREN) 75 MG EC tablet Take 1 tablet (75 mg total) by mouth 2 (two) times daily. 60 tablet 0   No current facility-administered medications for this visit.    No Known Allergies  Social History   Socioeconomic History  . Marital status: Significant Other    Spouse name: Not on file  . Number of children: 5  . Years of education: Not on file  . Highest education level: Not on file  Occupational History  . Not on file  Tobacco Use  . Smoking status: Never Smoker  . Smokeless tobacco: Never Used  Vaping Use  . Vaping Use: Never used  Substance and Sexual Activity  . Alcohol use: No  . Drug use: No  . Sexual activity: Not Currently  Other Topics Concern  . Not on file  Social History Narrative  . Not on file   Social Determinants of Health   Financial Resource Strain:   . Difficulty of Paying Living Expenses:   Food Insecurity:   . Worried About Programme researcher, broadcasting/film/video in the Last Year:   . Barista in the Last Year:   Transportation Needs:   . Freight forwarder (Medical):   Marland Kitchen Lack of Transportation (Non-Medical):   Physical Activity:   . Days of Exercise per Week:   . Minutes of Exercise per Session:   Stress:   . Feeling of Stress :   Social Connections:   . Frequency of Communication with Friends and Family:   .  Frequency of Social Gatherings with Friends and Family:   . Attends Religious Services:   . Active Member of Clubs or Organizations:   . Attends Banker Meetings:   Marland Kitchen Marital Status:   Intimate Partner Violence:   . Fear of Current or Ex-Partner:   . Emotionally Abused:   Marland Kitchen Physically Abused:   . Sexually Abused:     ROS Pertinent positives and negatives per hpi   Objective   Vitals as reported by the patient: Today's Vitals   11/17/19 0915  Weight: 193 lb (87.5 kg)  Height: 6\' 5"  (1.956 m)    Jeffrey Parker was seen today for establish care.  Diagnoses and all orders for this visit:  Costochondritis -      diclofenac (VOLTAREN) 75 MG EC tablet; Take 1 tablet (75 mg total) by mouth 2 (two) times daily.  Encounter to establish care   PLAN  Agree that this is likely costochondritis - will try diclofenac 75mg  PO bid PRN   Will see him in the office in a couple of weeks - if no improvement, will be able to pursue labs and imaging at that time for further work up.  Patient encouraged to call clinic with any questions, comments, or concerns.  I discussed the assessment and treatment plan with the patient. The patient was provided an opportunity to ask questions and all were answered. The patient agreed with the plan and demonstrated an understanding of the instructions.   The patient was advised to call back or seek an in-person evaluation if the symptoms worsen or if the condition fails to improve as anticipated.  I provided 18 minutes of non-face-to-face time during this encounter.  , NP  Primary [facility] at [facility]

## 2019-11-17 NOTE — Patient Instructions (Signed)
° ° ° °  If you have lab work done today you will be contacted with your lab results within the next 2 weeks.  If you have not heard from us then please contact us. The fastest way to get your results is to register for My Chart. ° ° °IF you received an x-ray today, you will receive an invoice from [facility]. Please contact [facility] at [phone] with questions or concerns regarding your invoice.  ° °IF you received labwork today, you will receive an invoice from [facility]. Please contact [facility] at [phone] with questions or concerns regarding your invoice.  ° °Our billing staff will not be able to assist you with questions regarding bills from these companies. ° °You will be contacted with the lab results as soon as they are available. The fastest way to get your results is to activate your My Chart account. Instructions are located on the last page of this paperwork. If you have not heard from us regarding the results in 2 weeks, please contact this office. °  ° ° ° °

## 2019-12-01 ENCOUNTER — Encounter: Payer: Self-pay | Admitting: Registered Nurse

## 2019-12-02 ENCOUNTER — Encounter: Payer: Self-pay | Admitting: Registered Nurse

## 2020-01-27 ENCOUNTER — Ambulatory Visit (INDEPENDENT_AMBULATORY_CARE_PROVIDER_SITE_OTHER): Payer: BC Managed Care – PPO | Admitting: Registered Nurse

## 2020-01-27 ENCOUNTER — Other Ambulatory Visit: Payer: Self-pay

## 2020-01-27 ENCOUNTER — Encounter: Payer: Self-pay | Admitting: Registered Nurse

## 2020-01-27 VITALS — BP 127/83 | HR 64 | Temp 98.0°F | Resp 18 | Ht 77.0 in | Wt 186.4 lb

## 2020-01-27 DIAGNOSIS — Z130 Encounter for screening for diseases of the blood and blood-forming organs and certain disorders involving the immune mechanism: Secondary | ICD-10-CM | POA: Diagnosis not present

## 2020-01-27 DIAGNOSIS — Z0001 Encounter for general adult medical examination with abnormal findings: Secondary | ICD-10-CM

## 2020-01-27 DIAGNOSIS — R012 Other cardiac sounds: Secondary | ICD-10-CM | POA: Diagnosis not present

## 2020-01-27 DIAGNOSIS — Z0000 Encounter for general adult medical examination without abnormal findings: Secondary | ICD-10-CM

## 2020-01-27 DIAGNOSIS — Z13228 Encounter for screening for other metabolic disorders: Secondary | ICD-10-CM | POA: Diagnosis not present

## 2020-01-27 DIAGNOSIS — Z13220 Encounter for screening for lipoid disorders: Secondary | ICD-10-CM

## 2020-01-27 DIAGNOSIS — Z1329 Encounter for screening for other suspected endocrine disorder: Secondary | ICD-10-CM

## 2020-01-27 DIAGNOSIS — K219 Gastro-esophageal reflux disease without esophagitis: Secondary | ICD-10-CM

## 2020-01-27 MED ORDER — PANTOPRAZOLE SODIUM 40 MG PO TBEC: 40.0000 mg | DELAYED_RELEASE_TABLET | Freq: Every day | ORAL | 3 refills | Status: DC

## 2020-01-27 NOTE — Progress Notes (Signed)
Established Patient Office Visit  Subjective:  Patient ID: Jeffrey Parker, male    DOB: 1985-10-03  Age: 34 y.o. MRN: [id number]  CC:  Chief Complaint  Patient presents with  . Annual Exam    Patient is here for an CPE patient would like to discuss some rib pain for about a year but its getting worse.    HPI Jeffrey Parker presents for CPE   Does note some mild ongoing abdominal pain. Feels as GERD. Has had this in the past. Epigastric. No radiation. Worse after eating spicy foods.   Notes some occasional palpitations - feels like heart is skipping a beat. No further CV symptoms. Denies lightheadedness, dizziness, chest pain, shob, doe, headache, visual changes, dependent edema, and claudication.   Feeling well overall. No major concerns Histories reviewed with patient, updated as warranted  Patient interested in getting routine labs collected today.   History reviewed. No pertinent past medical history.  History reviewed. No pertinent surgical history.  Family History  Problem Relation Age of Onset  . Healthy Mother   . Healthy Father     Social History   Socioeconomic History  . Marital status: Significant Other    Spouse name: Not on file  . Number of children: 5  . Years of education: Not on file  . Highest education level: Not on file  Occupational History  . Not on file  Tobacco Use  . Smoking status: Never Smoker  . Smokeless tobacco: Never Used  Vaping Use  . Vaping Use: Never used  Substance and Sexual Activity  . Alcohol use: No  . Drug use: No  . Sexual activity: Not Currently  Other Topics Concern  . Not on file  Social History Narrative  . Not on file   Social Determinants of Health   Financial Resource Strain:   . Difficulty of Paying Living Expenses: Not on file  Food Insecurity:   . Worried About Charity fundraiser in the Last Year: Not on file  . Ran Out of Food in the Last Year: Not on file  Transportation Needs:   . Lack  of Transportation (Medical): Not on file  . Lack of Transportation (Non-Medical): Not on file  Physical Activity:   . Days of Exercise per Week: Not on file  . Minutes of Exercise per Session: Not on file  Stress:   . Feeling of Stress : Not on file  Social Connections:   . Frequency of Communication with Friends and Family: Not on file  . Frequency of Social Gatherings with Friends and Family: Not on file  . Attends Religious Services: Not on file  . Active Member of Clubs or Organizations: Not on file  . Attends Archivist Meetings: Not on file  . Marital Status: Not on file  Intimate Partner Violence:   . Fear of Current or Ex-Partner: Not on file  . Emotionally Abused: Not on file  . Physically Abused: Not on file  . Sexually Abused: Not on file    Outpatient Medications Prior to Visit  Medication Sig Dispense Refill  . diclofenac (VOLTAREN) 75 MG EC tablet Take 1 tablet (75 mg total) by mouth 2 (two) times daily. 60 tablet 0   No facility-administered medications prior to visit.    No Known Allergies  ROS Review of Systems  Constitutional: Negative.   HENT: Negative.   Eyes: Negative.   Respiratory: Negative.   Cardiovascular: Positive for palpitations. Negative for chest pain  and leg swelling.  Gastrointestinal: Positive for abdominal pain. Negative for abdominal distention, anal bleeding, blood in stool, constipation, diarrhea, nausea, rectal pain and vomiting.  Endocrine: Negative.   Genitourinary: Negative.   Musculoskeletal: Negative.   Skin: Negative.   Allergic/Immunologic: Negative.   Neurological: Negative.   Hematological: Negative.   Psychiatric/Behavioral: Negative.       Objective:    Physical Exam Vitals and nursing note reviewed.  Constitutional:      General: He is not in acute distress.    Appearance: Normal appearance. He is normal weight. He is not ill-appearing, toxic-appearing or diaphoretic.  HENT:     Head: Normocephalic  and atraumatic.     Right Ear: Tympanic membrane, ear canal and external ear normal. There is no impacted cerumen.     Left Ear: Tympanic membrane, ear canal and external ear normal. There is no impacted cerumen.     Nose: Nose normal. No congestion or rhinorrhea.     Mouth/Throat:     Mouth: Mucous membranes are moist.     Pharynx: Oropharynx is clear. No oropharyngeal exudate or posterior oropharyngeal erythema.  Eyes:     General: No scleral icterus.       Right eye: No discharge.        Left eye: No discharge.     Extraocular Movements: Extraocular movements intact.     Conjunctiva/sclera: Conjunctivae normal.     Pupils: Pupils are equal, round, and reactive to light.  Neck:     Vascular: No carotid bruit.  Cardiovascular:     Rate and Rhythm: Bradycardia present. Rhythm irregular.     Pulses: Normal pulses.     Heart sounds: No murmur heard.  No friction rub. No gallop.   Pulmonary:     Effort: Pulmonary effort is normal. No respiratory distress.     Breath sounds: Normal breath sounds. No stridor. No wheezing, rhonchi or rales.  Chest:     Chest wall: No tenderness.  Abdominal:     General: Abdomen is flat. Bowel sounds are normal. There is no distension.     Palpations: Abdomen is soft. There is no mass.     Tenderness: There is no abdominal tenderness. There is no right CVA tenderness, left CVA tenderness, guarding or rebound.     Hernia: No hernia is present.  Musculoskeletal:        General: No swelling, tenderness, deformity or signs of injury. Normal range of motion.     Cervical back: Normal range of motion and neck supple. No rigidity or tenderness.     Right lower leg: No edema.     Left lower leg: No edema.  Lymphadenopathy:     Cervical: No cervical adenopathy.  Skin:    General: Skin is warm and dry.     Capillary Refill: Capillary refill takes less than 2 seconds.     Coloration: Skin is not jaundiced or pale.     Findings: No bruising, erythema, lesion  or rash.  Neurological:     General: No focal deficit present.     Mental Status: He is alert and oriented to person, place, and time. Mental status is at baseline.     Cranial Nerves: No cranial nerve deficit.     Motor: No weakness.     Gait: Gait normal.  Psychiatric:        Mood and Affect: Mood normal.        Behavior: Behavior normal.  Thought Content: Thought content normal.        Judgment: Judgment normal.     BP 127/83   Pulse 64   Temp 98 F (36.7 C) (Temporal)   Resp 18   Ht 6' 5"  (1.956 m)   Wt 186 lb 6.4 oz (84.6 kg)   SpO2 99%   BMI 22.10 kg/m  Wt Readings from Last 3 Encounters:  01/27/20 186 lb 6.4 oz (84.6 kg)  11/17/19 193 lb (87.5 kg)     There are no preventive care reminders to display for this patient.  There are no preventive care reminders to display for this patient.  No results found for: TSH No results found for: WBC, HGB, HCT, MCV, PLT No results found for: NA, K, CHLORIDE, CO2, GLUCOSE, BUN, CREATININE, BILITOT, ALKPHOS, AST, ALT, PROT, ALBUMIN, CALCIUM, ANIONGAP, EGFR, GFR No results found for: CHOL No results found for: HDL No results found for: LDLCALC No results found for: TRIG No results found for: CHOLHDL No results found for: HGBA1C    Assessment & Plan:   Problem List Items Addressed This Visit      Other   Abnormal heart sounds   Relevant Orders   EKG 12-Lead (Completed)    Other Visit Diagnoses    Annual physical exam    -  Primary   Screening for endocrine, metabolic and immunity disorder       Relevant Orders   CBC With Differential   Comprehensive metabolic panel   TSH   Hemoglobin A1c   Lipid screening       Relevant Orders   Lipid panel      No orders of the defined types were placed in this encounter.   Follow-up: No follow-ups on file.   PLAN  Labs collected, will follow up as warranted  Exam shows irregular bradycardia, otherwise normal limits  EKG obtained in response to abnormal  heart sounds and rhythm. No comparison available. EKG shows type ii block. No clear cause for this. It is concerning given age and overall health. Denies drug use and tick bites. No other contributing factors. Pt needs to be assessed by cardiology.  Patient encouraged to call clinic with any questions, comments, or concerns.  Maximiano Coss, NP

## 2020-01-27 NOTE — Patient Instructions (Signed)
° ° ° °  If you have lab work done today you will be contacted with your lab results within the next 2 weeks.  If you have not heard from us then please contact us. The fastest way to get your results is to register for My Chart. ° ° °IF you received an x-ray today, you will receive an invoice from [facility]. Please contact [facility] at [phone] with questions or concerns regarding your invoice.  ° °IF you received labwork today, you will receive an invoice from [facility]. Please contact [facility] at [phone] with questions or concerns regarding your invoice.  ° °Our billing staff will not be able to assist you with questions regarding bills from these companies. ° °You will be contacted with the lab results as soon as they are available. The fastest way to get your results is to activate your My Chart account. Instructions are located on the last page of this paperwork. If you have not heard from us regarding the results in 2 weeks, please contact this office. °  ° ° ° °

## 2020-01-28 LAB — COMPREHENSIVE METABOLIC PANEL
ALT: 17 IU/L (ref 0–44)
AST: 18 IU/L (ref 0–40)
Albumin/Globulin Ratio: 1.9 (ref 1.2–2.2)
Albumin: 4.8 g/dL (ref 4.0–5.0)
Alkaline Phosphatase: 74 IU/L (ref 44–121)
BUN/Creatinine Ratio: 13 (ref 9–20)
BUN: 12 mg/dL (ref 6–20)
Bilirubin Total: 0.4 mg/dL (ref 0.0–1.2)
CO2: 25 mmol/L (ref 20–29)
Calcium: 9.2 mg/dL (ref 8.7–10.2)
Chloride: 103 mmol/L (ref 96–106)
Creatinine, Ser: 0.9 mg/dL (ref 0.76–1.27)
GFR calc Af Amer: 130 mL/min/{1.73_m2} (ref >59)
GFR calc non Af Amer: 113 mL/min/{1.73_m2} (ref >59)
Globulin, Total: 2.5 g/dL (ref 1.5–4.5)
Glucose: 84 mg/dL (ref 65–99)
Potassium: 4.6 mmol/L (ref 3.5–5.2)
Sodium: 139 mmol/L (ref 134–144)
Total Protein: 7.3 g/dL (ref 6.0–8.5)

## 2020-01-28 LAB — CBC WITH DIFFERENTIAL
Basophils Absolute: 0 10*3/uL (ref 0.0–0.2)
Basos: 0 %
EOS (ABSOLUTE): 0.1 10*3/uL (ref 0.0–0.4)
Eos: 3 %
Hematocrit: 44.9 % (ref 37.5–51.0)
Hemoglobin: 14.6 g/dL (ref 13.0–17.7)
Immature Grans (Abs): 0 10*3/uL (ref 0.0–0.1)
Immature Granulocytes: 0 %
Lymphocytes Absolute: 1.8 10*3/uL (ref 0.7–3.1)
Lymphs: 42 %
MCH: 25.7 pg — ABNORMAL LOW (ref 26.6–33.0)
MCHC: 32.5 g/dL (ref 31.5–35.7)
MCV: 79 fL (ref 79–97)
Monocytes Absolute: 0.3 10*3/uL (ref 0.1–0.9)
Monocytes: 7 %
Neutrophils Absolute: 2.1 10*3/uL (ref 1.4–7.0)
Neutrophils: 48 %
RBC: 5.67 x10E6/uL (ref 4.14–5.80)
RDW: 12.6 % (ref 11.6–15.4)
WBC: 4.3 10*3/uL (ref 3.4–10.8)

## 2020-01-28 LAB — LIPID PANEL
Chol/HDL Ratio: 2.6 ratio (ref 0.0–5.0)
Cholesterol, Total: 175 mg/dL (ref 100–199)
HDL: 67 mg/dL (ref >39)
LDL Chol Calc (NIH): 99 mg/dL (ref 0–99)
Triglycerides: 41 mg/dL (ref 0–149)
VLDL Cholesterol Cal: 9 mg/dL (ref 5–40)

## 2020-01-28 LAB — HEMOGLOBIN A1C
Est. average glucose Bld gHb Est-mCnc: 117 mg/dL
Hgb A1c MFr Bld: 5.7 % — ABNORMAL HIGH (ref 4.8–5.6)

## 2020-01-28 LAB — TSH: TSH: 2.1 u[IU]/mL (ref 0.450–4.500)

## 2020-01-30 LAB — B. BURGDORFI ANTIBODIES: Lyme IgG/IgM Ab: <0.91 {ISR} (ref 0.00–0.90)

## 2020-01-30 LAB — SPECIMEN STATUS REPORT

## 2020-01-30 LAB — ROCKY MTN SPOTTED FVR AB, IGM-BLOOD: RMSF IgM: 0.3 index (ref 0.00–0.89)

## 2020-02-09 ENCOUNTER — Encounter: Payer: Self-pay | Admitting: Registered Nurse

## 2020-03-11 ENCOUNTER — Ambulatory Visit: Payer: BC Managed Care – PPO

## 2020-03-11 ENCOUNTER — Ambulatory Visit: Payer: BC Managed Care – PPO | Admitting: Cardiology

## 2020-03-11 ENCOUNTER — Other Ambulatory Visit: Payer: Self-pay

## 2020-03-11 ENCOUNTER — Encounter: Payer: Self-pay | Admitting: Cardiology

## 2020-03-11 VITALS — BP 121/85 | HR 64 | Resp 16 | Ht 77.0 in | Wt 185.8 lb

## 2020-03-11 DIAGNOSIS — I441 Atrioventricular block, second degree: Secondary | ICD-10-CM

## 2020-03-11 DIAGNOSIS — R001 Bradycardia, unspecified: Secondary | ICD-10-CM | POA: Diagnosis not present

## 2020-03-11 DIAGNOSIS — R011 Cardiac murmur, unspecified: Secondary | ICD-10-CM

## 2020-03-11 NOTE — Progress Notes (Signed)
Date:  03/11/2020   ID:  Jeffrey Parker, DOB [birth year], MRN [id number]  PCP:  Janeece Agee, NP  Cardiologist:  Tessa Lerner, DO, [facility] (established care 03/11/2020)  REASON FOR CONSULT: Abnormal heart sounds  REQUESTING PHYSICIAN:  Janeece Agee, NP [address] [place],  [place] [address]  Chief Complaint  Patient presents with  . Abnormal Heart Sounds  . New Patient (Initial Visit)    Referred by Dr. Janeece Agee    HPI  [name] is a 33 y.o. male who presents to the office with a chief complaint of " abnormal heart sounds."  He denies any significant past cardiac history.  He is referred to the office at the request of Janeece Agee, NP for evaluation of abnormal heart sounds.  Patient is accompanied by his fianc Winter at today's office visit.  He provides verbal consent in regards to discussing his medical history and plan of care in her presence.  Patient states that while growing up as needed abnormal heart sounds for years.  Patient's fianc states that when she lays on his chest she does not always hear the "lub and dub" associated with heart sounds.  He has not been evaluated by pediatric cardiologist while growing up.  He is played basketball for his entire life and never had any exertional syncope.  No family history of premature coronary artery disease or cardiomyopathy.  He denies anginal chest pain, heart failure symptoms, near-syncope or syncope.  Patient recently went for his yearly physical in October and asked for cardiology referral for further evaluation and management.  FUNCTIONAL STATUS: Works at [employer] so usually on his feet and enjoys playing basketball regularly.    ALLERGIES: No Known Allergies  MEDICATION LIST PRIOR TO VISIT: Current Meds  Medication Sig  . pantoprazole (PROTONIX) 40 MG tablet Take 1 tablet (40 mg total) by mouth daily.     PAST MEDICAL HISTORY: History reviewed. No pertinent past medical history.  PAST  SURGICAL HISTORY: History reviewed. No pertinent surgical history.  FAMILY HISTORY: The patient family history includes Healthy in his father and mother.  SOCIAL HISTORY:  The patient  reports that he has never smoked. He has never used smokeless tobacco. He reports that he does not drink alcohol and does not use drugs.  Review of Systems  Constitutional: Negative for chills and fever.  HENT: Negative for hoarse voice and nosebleeds.   Eyes: Negative for discharge, double vision and pain.  Cardiovascular: Negative for chest pain, claudication, dyspnea on exertion, leg swelling, near-syncope, orthopnea, palpitations, paroxysmal nocturnal dyspnea and syncope.  Respiratory: Negative for hemoptysis and shortness of breath.   Musculoskeletal: Negative for muscle cramps and myalgias.  Gastrointestinal: Negative for abdominal pain, constipation, diarrhea, hematemesis, hematochezia, melena, nausea and vomiting.  Neurological: Negative for dizziness and light-headedness.  All other systems reviewed and are negative.   PHYSICAL EXAM: Vitals with BMI 03/11/2020 01/27/2020 11/17/2019  Height 6\' 5"  6\' 5"  6\' 5"   Weight 185 lbs 13 oz 186 lbs 6 oz 193 lbs  BMI 22.03 22.1 22.88  Systolic 121 127 -  Diastolic 85 83 -  Pulse 64 64 -    CONSTITUTIONAL: Well-developed and well-nourished. No acute distress.  SKIN: Skin is warm and dry. No rash noted. No cyanosis. No pallor. No jaundice HEAD: Normocephalic and atraumatic.  EYES: No scleral icterus MOUTH/THROAT: Moist oral membranes.  NECK: No JVD present. No thyromegaly noted. No carotid bruits  LYMPHATIC: No visible cervical adenopathy.  CHEST Normal respiratory effort.  No intercostal retractions  LUNGS: Clear to auscultation bilaterally. No stridor. No wheezes. No rales.  CARDIOVASCULAR: Regular rate and rhythm, positive S1-S2, soft holosystolic murmur heard at the apex, no rubs or gallops appreciated. ABDOMINAL: No apparent ascites.  EXTREMITIES:  No peripheral edema, 2+ bilateral dorsalis pedis and posterior tibial pulses HEMATOLOGIC: No significant bruising NEUROLOGIC: Oriented to person, place, and time. Nonfocal. Normal muscle tone.  PSYCHIATRIC: Normal mood and affect. Normal behavior. Cooperative  CARDIAC DATABASE: EKG: 03/11/2020: Sinus bradycardia (atrial rate 60 bpm, ventricular rate 47 bpm), left atrial enlargement, poor R wave progression, second-degree type I AV block.  01/27/2020: Sinus bradycardia (atrial rate 60 bpm, ventricular rate 30 bpm), T wave inversions in the V1 V2 leads, second-degree AV block.  Echocardiogram: None  Stress Testing: None  LABORATORY DATA: CBC Latest Ref Rng & Units 01/27/2020  WBC 3.4 - 10.8 x10E3/uL 4.3  Hemoglobin 13.0 - 17.7 g/dL 30.1  Hematocrit 60.1 - 51.0 % 44.9    CMP Latest Ref Rng & Units 01/27/2020  Glucose 65 - 99 mg/dL 84  BUN 6 - 20 mg/dL 12  Creatinine 0.93 - 2.35 mg/dL 5.73  Sodium 220 - 254 mmol/L 139  Potassium 3.5 - 5.2 mmol/L 4.6  Chloride 96 - 106 mmol/L 103  CO2 20 - 29 mmol/L 25  Calcium 8.7 - 10.2 mg/dL 9.2  Total Protein 6.0 - 8.5 g/dL 7.3  Total Bilirubin 0.0 - 1.2 mg/dL 0.4  Alkaline Phos 44 - 121 IU/L 74  AST 0 - 40 IU/L 18  ALT 0 - 44 IU/L 17    Lipid Panel     Component Value Date/Time   CHOL 175 01/27/2020 1451   TRIG 41 01/27/2020 1451   HDL 67 01/27/2020 1451   CHOLHDL 2.6 01/27/2020 1451   LDLCALC 99 01/27/2020 1451   LABVLDL 9 01/27/2020 1451    No components found for: NTPROBNP No results for input(s): PROBNP in the last 8760 hours. Recent Labs    01/27/20 1451  TSH 2.100    BMP Recent Labs    01/27/20 1451  NA 139  K 4.6  CL 103  CO2 25  GLUCOSE 84  BUN 12  CREATININE 0.90  CALCIUM 9.2  GFRNONAA 113  GFRAA 130    HEMOGLOBIN A1C Lab Results  Component Value Date   HGBA1C 5.7 (H) 01/27/2020    IMPRESSION:    ICD-10-CM   1. Bradycardia  R00.1 EKG 12-Lead    ECHOCARDIOGRAM COMPLETE    LONG TERM  MONITOR-LIVE TELEMETRY (3-14 DAYS)  2. Second degree AV block  I44.1 LONG TERM MONITOR-LIVE TELEMETRY (3-14 DAYS)  3. Cardiac murmur  R01.1 ECHOCARDIOGRAM COMPLETE     RECOMMENDATIONS: [name] is a 34 y.o. male who presents with a chief complaint of abnormal heart sounds without any past cardiac history.  Bradycardia: Today's EKG is consistent with second-degree type I AV block with Wenckebach physiology. EKG performed in October 2021 notes sinus rhythm with 2:1 second-degree AV block. Echocardiogram will be ordered to evaluate for structural heart disease and left ventricular systolic function. 14-day mobile cardiac ambulatory telemetry to evaluate for underlying heart block and dysrhythmias. No family history of premature CAD, cardiomyopathy, or infiltrative heart disease. Patient denies any history of tick bites, [facility] spotted fever.  Labs reviewed. TSH within normal limits. Patient did undergo a 6-minute walk test during today's office visit (at time 0 pulse of 71 bpm & 98% RA, 2 minutes mark 87bpm & 99% RA, 4-minute mark 94bpm &  99%RA, and finished at 95bpm & 99%RA).  Patient is asked to seek medical attention if he has symptoms of lightheadedness, dizziness, near syncope or syncope.   Patient is made aware that he cannot drive or operate machinery if he has episodes of near syncope or syncope.  He verbalizes understanding.  Secondary AV block: 14-day mobile cardiac ambulatory telemetry to evaluate for heart block or other dysrhythmias.  Cardiac murmur: Patient is noted to have very soft holosystolic murmur heard at the apex consistent with MR.  Echocardiogram will be ordered to evaluate for structural heart disease and left ventricular systolic function.   FINAL MEDICATION LIST END OF ENCOUNTER: No orders of the defined types were placed in this encounter.   Medications Discontinued During This Encounter  Medication Reason  . diclofenac (VOLTAREN) 75 MG EC  tablet Patient Preference     Current Outpatient Medications:  .  pantoprazole (PROTONIX) 40 MG tablet, Take 1 tablet (40 mg total) by mouth daily., Disp: 30 tablet, Rfl: 3  Orders Placed This Encounter  Procedures  . LONG TERM MONITOR-LIVE TELEMETRY (3-14 DAYS)  . EKG 12-Lead  . ECHOCARDIOGRAM COMPLETE    There are no Patient Instructions on file for this visit.   --Continue cardiac medications as reconciled in final medication list. --No follow-ups on file. Or sooner if needed. --Continue follow-up with your primary care physician regarding the management of your other chronic comorbid conditions.  Patient's questions and concerns were addressed to his satisfaction. He voices understanding of the instructions provided during this encounter.   This note was created using a voice recognition software as a result there may be grammatical errors inadvertently enclosed that do not reflect the nature of this encounter. Every attempt is made to correct such errors.  Tessa Lerner, [place], [facility]  Pager: [phone] Office: [phone]

## 2020-03-14 ENCOUNTER — Emergency Department (HOSPITAL_COMMUNITY)
Admission: EM | Admit: 2020-03-14 | Discharge: 2020-03-15 | Disposition: A | Discharge status: Home / Self Care | Payer: BC Managed Care – PPO | Attending: Emergency Medicine | Admitting: Emergency Medicine

## 2020-03-14 ENCOUNTER — Emergency Department (HOSPITAL_COMMUNITY): Payer: BC Managed Care – PPO

## 2020-03-14 DIAGNOSIS — I440 Atrioventricular block, first degree: Secondary | ICD-10-CM | POA: Diagnosis not present

## 2020-03-14 DIAGNOSIS — R0600 Dyspnea, unspecified: Secondary | ICD-10-CM | POA: Diagnosis not present

## 2020-03-14 DIAGNOSIS — I498 Other specified cardiac arrhythmias: Secondary | ICD-10-CM

## 2020-03-14 DIAGNOSIS — R000 Tachycardia, unspecified: Secondary | ICD-10-CM | POA: Diagnosis not present

## 2020-03-14 DIAGNOSIS — R002 Palpitations: Secondary | ICD-10-CM

## 2020-03-14 DIAGNOSIS — R0989 Other specified symptoms and signs involving the circulatory and respiratory systems: Secondary | ICD-10-CM | POA: Diagnosis not present

## 2020-03-14 DIAGNOSIS — I499 Cardiac arrhythmia, unspecified: Secondary | ICD-10-CM | POA: Insufficient documentation

## 2020-03-14 DIAGNOSIS — J90 Pleural effusion, not elsewhere classified: Secondary | ICD-10-CM | POA: Diagnosis not present

## 2020-03-14 DIAGNOSIS — I491 Atrial premature depolarization: Secondary | ICD-10-CM | POA: Diagnosis not present

## 2020-03-14 LAB — BASIC METABOLIC PANEL
Anion gap: 8 (ref 5–15)
BUN: 14 mg/dL (ref 6–20)
CO2: 26 mmol/L (ref 22–32)
Calcium: 8.8 mg/dL — ABNORMAL LOW (ref 8.9–10.3)
Chloride: 104 mmol/L (ref 98–111)
Creatinine, Ser: 0.99 mg/dL (ref 0.61–1.24)
GFR, Estimated: >60 mL/min (ref >60)
Glucose, Bld: 114 mg/dL — ABNORMAL HIGH (ref 70–99)
Potassium: 3.7 mmol/L (ref 3.5–5.1)
Sodium: 138 mmol/L (ref 135–145)

## 2020-03-14 LAB — CBC
HCT: 40.5 % (ref 39.0–52.0)
Hemoglobin: 13.5 g/dL (ref 13.0–17.0)
MCH: 27 pg (ref 26.0–34.0)
MCHC: 33.3 g/dL (ref 30.0–36.0)
MCV: 81 fL (ref 80.0–100.0)
Platelets: 241 10*3/uL (ref 150–400)
RBC: 5 MIL/uL (ref 4.22–5.81)
RDW: 12.3 % (ref 11.5–15.5)
WBC: 6 10*3/uL (ref 4.0–10.5)
nRBC: 0 % (ref 0.0–0.2)

## 2020-03-14 LAB — TROPONIN I (HIGH SENSITIVITY): Troponin I (High Sensitivity): 3 ng/L (ref <18)

## 2020-03-14 NOTE — ED Triage Notes (Signed)
The pt is c/o an episode of a rapid heart rate one hour ago while waking up from a nap  No chest pain no drug use  He was brought in by [facility] from [facility]  No distress

## 2020-03-14 NOTE — ED Triage Notes (Signed)
Patient arrived with EMS from home  Reports feeling tired  With dizziness and fatigue this evening.

## 2020-03-14 NOTE — ED Triage Notes (Signed)
Iv per ems 

## 2020-03-15 ENCOUNTER — Telehealth: Payer: Self-pay

## 2020-03-15 ENCOUNTER — Other Ambulatory Visit: Payer: Self-pay

## 2020-03-15 LAB — TROPONIN I (HIGH SENSITIVITY): Troponin I (High Sensitivity): 6 ng/L (ref <18)

## 2020-03-15 NOTE — ED Provider Notes (Signed)
[facility] EMERGENCY DEPARTMENT Provider Note   CSN: [id number] Arrival date & time: 03/14/20  2138     History Chief Complaint  Patient presents with  . Tachycardia    Jeffrey Parker is a 34 y.o. male.  Patient to ED for evaluation of episode of heart racing that woke him from sleep around 8:30 last night. There was associated dizziness but no syncope or near syncope. No chest pain, SoB, vomiting. No recent illness, cough or fever. He is wearing a cardiac monitor placed for evaluation of cardiac arrhythmia by [facility]) and reports he is scheduled for a ECHO as well. He reports the fast-rate palpitations lasted for less than 10 minutes and the dizziness lasted approximately 1 hour. No symptoms now.   The history is provided by the patient. No language interpreter was used.       No past medical history on file.  Patient Active Problem List   Diagnosis Date Noted  . Abnormal heart sounds 01/27/2020    No past surgical history on file.     Family History  Problem Relation Age of Onset  . Healthy Mother   . Healthy Father     Social History   Tobacco Use  . Smoking status: Never Smoker  . Smokeless tobacco: Never Used  Vaping Use  . Vaping Use: Never used  Substance Use Topics  . Alcohol use: Never  . Drug use: Never    Home Medications Prior to Admission medications   Medication Sig Start Date End Date Taking? Authorizing Provider  pantoprazole (PROTONIX) 40 MG tablet Take 1 tablet (40 mg total) by mouth daily. 01/27/20  Yes Janeece Agee, NP  SUMAtriptan (IMITREX) 50 MG tablet Take 1 tablet (50 mg total) by mouth every 2 (two) hours as needed for migraine. May repeat in 2 hours if headache persists or recurs. Patient not taking: Reported on 06/23/2019 05/01/19 06/23/19  Frederica Kuster, MD    Allergies    Patient has no known allergies.  Review of Systems   Review of Systems  Constitutional: Negative for chills  and fever.  HENT: Negative.   Respiratory: Negative.  Negative for shortness of breath.   Cardiovascular: Positive for palpitations. Negative for chest pain.  Gastrointestinal: Negative.   Musculoskeletal: Negative.   Skin: Negative.   Neurological: Positive for dizziness. Negative for syncope.    Physical Exam Updated Vital Signs BP 132/88   Pulse (!) 52   Temp 98 F (36.7 C) (Oral)   Resp 17   SpO2 99%   Physical Exam Vitals and nursing note reviewed.  Constitutional:      Appearance: He is well-developed and well-nourished.  HENT:     Head: Normocephalic.  Cardiovascular:     Rate and Rhythm: Normal rate and regular rhythm.     Heart sounds: Murmur heard.    Pulmonary:     Effort: Pulmonary effort is normal.     Breath sounds: Normal breath sounds. No wheezing, rhonchi or rales.  Abdominal:     General: Bowel sounds are normal.     Palpations: Abdomen is soft.     Tenderness: There is no abdominal tenderness. There is no guarding or rebound.  Musculoskeletal:        General: Normal range of motion.     Cervical back: Normal range of motion and neck supple.  Skin:    General: Skin is warm and dry.  Neurological:     Mental Status: He is  alert and oriented to person, place, and time.  Psychiatric:        Mood and Affect: Mood and affect normal.     ED Results / Procedures / Treatments   Labs (all labs ordered are listed, but only abnormal results are displayed) Labs Reviewed  BASIC METABOLIC PANEL - Abnormal; Notable for the following components:      Result Value   Glucose, Bld 114 (*)    Calcium 8.8 (*)    All other components within normal limits  CBC  TROPONIN I (HIGH SENSITIVITY)  TROPONIN I (HIGH SENSITIVITY)   Results for orders placed or performed during the hospital encounter of 03/14/20  Basic metabolic panel  Result Value Ref Range   Sodium 138 135 - 145 mmol/L   Potassium 3.7 3.5 - 5.1 mmol/L   Chloride 104 98 - 111 mmol/L   CO2 26 22 -  32 mmol/L   Glucose, Bld 114 (H) 70 - 99 mg/dL   BUN 14 6 - 20 mg/dL   Creatinine, Ser 2.99 0.61 - 1.24 mg/dL   Calcium 8.8 (L) 8.9 - 10.3 mg/dL   GFR, Estimated >37 >16 mL/min   Anion gap 8 5 - 15  CBC  Result Value Ref Range   WBC 6.0 4.0 - 10.5 K/uL   RBC 5.00 4.22 - 5.81 MIL/uL   Hemoglobin 13.5 13.0 - 17.0 g/dL   HCT 96.7 89.3 - 81.0 %   MCV 81.0 80.0 - 100.0 fL   MCH 27.0 26.0 - 34.0 pg   MCHC 33.3 30.0 - 36.0 g/dL   RDW 17.5 10.2 - 58.5 %   Platelets 241 150 - 400 K/uL   nRBC 0.0 0.0 - 0.2 %  Troponin I (High Sensitivity)  Result Value Ref Range   Troponin I (High Sensitivity) 3 <18 ng/L  Troponin I (High Sensitivity)  Result Value Ref Range   Troponin I (High Sensitivity) 6 <18 ng/L     EKG EKG Interpretation  Date/Time:  Monday March 15 2020 04:23:44 EST Ventricular Rate:  45 PR Interval:  422 QRS Duration: 90 QT Interval:  478 QTC Calculation: 414 R Axis:   86 Text Interpretation: Sinus bradycardia Second deg AVB, Mobitz I (Wenckebach) Right atrial enlargement Anterior infarct, old Minimal ST elevation, inferior leads Confirmed by Zadie Rhine ([id number]) on 03/15/2020 4:32:45 AM   Radiology DG Chest 2 View  Result Date: 03/14/2020 CLINICAL DATA:  Tachycardia, dyspnea EXAM: CHEST - 2 VIEW COMPARISON:  None. FINDINGS: Lungs are well expanded, symmetric, and clear. No pneumothorax or pleural effusion. Cardiac size within normal limits. Pulmonary vascularity is normal. Osseous structures are age-appropriate. No acute bone abnormality. IMPRESSION: No active cardiopulmonary disease. Electronically Signed   By: Helyn Numbers MD   On: 03/14/2020 22:24    Procedures Procedures (including critical care time)  Medications Ordered in ED Medications - No data to display  ED Course  I have reviewed the triage vital signs and the nursing notes.  Pertinent labs & imaging results that were available during my care of the patient were reviewed by me and  considered in my medical decision making (see chart for details).    MDM Rules/Calculators/A&P                          Patient to ED with symptoms of heart racing and dizziness, now resolved. Currently under evaluation for cardiac arrhythmia by Dr. Heywood Footman. No chest pain.   EKG is compared  to recent in-office EKG and shows 2nd degree heart block without change. Bradycardic here to 45.   Labs unremarkable. Will page cardiology to discuss symptoms who will possibly be able to review information from monitor not available to ED.   Discussed with Dr. Jacinto Halim. The monitor information is not able to be accessed and the unit should be returned to the office to be interpreted. He can be discharged home, no restrictions on activity.    Final Clinical Impression(s) / ED Diagnoses Final diagnoses:  None   1. Palpitations 2. Arrhythmia  Rx / DC Orders ED Discharge Orders    None       Elpidio Anis, Cordelia Poche 03/15/20 2025    Zadie Rhine, MD 03/15/20 [phone]

## 2020-03-15 NOTE — Discharge Instructions (Addendum)
Per your cardiologist's instruction, please remove the monitor and mail/take to the office so they can access the report of activity tonight. You can be discharged home and will need to follow up in the office with them.   Return to the ED with any new or concerning symptoms.

## 2020-03-15 NOTE — Telephone Encounter (Signed)
Patient wants to speak to you he was rushed to the ED yesterday he was discharge a couple hours ago he was feeling lightheaded and dizziness and his heart rate was very elevated. Pt is wearing a monitor pt states he is still wearing it but the ED told him he should take it off but pt would like to speak to you personally please advise   Best number to contact pt is [phone]

## 2020-03-15 NOTE — ED Notes (Signed)
Patient verbalized understanding of discharge instructions. Opportunity for questions and answers.  

## 2020-03-15 NOTE — ED Notes (Signed)
RN made aware of patient heart rate

## 2020-03-16 ENCOUNTER — Telehealth: Payer: Self-pay

## 2020-03-16 NOTE — Telephone Encounter (Signed)
[employer] is requesting office notes, I faxed them

## 2020-03-16 NOTE — Telephone Encounter (Signed)
Spoke to the patient over the phone.  Stays that his BP is stable and still wearing the monitor. Asked him to finish the duration of the monitor and send it in for review. He has asked to change position slowly and keeping himself well hydrated. Advised to discuss the non-cardiac symptoms with his PCP for further direction.

## 2020-03-19 ENCOUNTER — Telehealth: Payer: Self-pay | Admitting: Cardiology

## 2020-03-19 NOTE — Telephone Encounter (Signed)
Alert from [facility]:   Patient's underlying rhythm is Normal sinus w/ secondary type I AV Block.  He has a Cytogeneticist to evaluate for dysrhythmia or AV Block.  Patient received a call from [facility] to see if he was okay due to transmission that came across earlier this morning.   AT 556am on 03/19/2020 he had complete heart block for 9sec while he was a sleep. Patient was asymptomatic.   He returns the monitor on 03/25/2020. I have requested an expedite turn around on the report when done.   I called the patient he as well. He is asymptomatic doing his normal routine, playing basketball, and etc. He is requesting a work note from 03/16/2020 through 03/20/2020 and return back on 03/21/2020.   Patient is asked to take it easy and not to drive or operate heavy machinery  until the work up isn't complete. He is instructed to trigger the monitor if he has symptoms of lightheadedness, dizziness, near syncope, or syncope.   Tessa Lerner, [place], [facility]  Pager: [phone] Office: [phone]

## 2020-03-23 ENCOUNTER — Ambulatory Visit (HOSPITAL_COMMUNITY)
Admission: RE | Admit: 2020-03-23 | Discharge: 2020-03-23 | Disposition: A | Discharge status: Home / Self Care | Payer: BC Managed Care – PPO | Source: Ambulatory Visit | Attending: Cardiology | Admitting: Cardiology

## 2020-03-23 ENCOUNTER — Other Ambulatory Visit: Payer: Self-pay

## 2020-03-23 DIAGNOSIS — R001 Bradycardia, unspecified: Secondary | ICD-10-CM | POA: Diagnosis not present

## 2020-03-23 DIAGNOSIS — R011 Cardiac murmur, unspecified: Secondary | ICD-10-CM | POA: Diagnosis not present

## 2020-03-23 DIAGNOSIS — I071 Rheumatic tricuspid insufficiency: Secondary | ICD-10-CM | POA: Diagnosis not present

## 2020-04-04 DIAGNOSIS — R001 Bradycardia, unspecified: Secondary | ICD-10-CM | POA: Insufficient documentation

## 2020-04-04 DIAGNOSIS — R011 Cardiac murmur, unspecified: Secondary | ICD-10-CM | POA: Insufficient documentation

## 2020-04-04 LAB — ECHOCARDIOGRAM COMPLETE
Area-P 1/2: 3.31 cm2
S' Lateral: 3.2 cm

## 2020-04-16 ENCOUNTER — Ambulatory Visit: Payer: BC Managed Care – PPO | Admitting: Cardiology

## 2020-08-31 NOTE — Progress Notes (Signed)
Both numbers on the chart are not working

## 2020-09-03 NOTE — Progress Notes (Signed)
I have called both numbers in his chart they are both out of service.

## 2020-09-08 NOTE — Progress Notes (Signed)
I finally got a hold of patient and he stated the reason he hasn't scheduled a f/u is because last time he spoke to you you went over his results and everything came back fine please advise  Best number to contact patient is [phone]

## 2021-08-30 ENCOUNTER — Emergency Department (HOSPITAL_COMMUNITY): Payer: 59

## 2021-08-30 ENCOUNTER — Other Ambulatory Visit: Payer: Self-pay

## 2021-08-30 ENCOUNTER — Emergency Department (HOSPITAL_COMMUNITY)
Admission: EM | Admit: 2021-08-30 | Discharge: 2021-08-30 | Disposition: A | Discharge status: Home / Self Care | Payer: 59 | Source: Home / Self Care | Attending: Emergency Medicine | Admitting: Emergency Medicine

## 2021-08-30 ENCOUNTER — Emergency Department (HOSPITAL_COMMUNITY)
Admission: EM | Admit: 2021-08-30 | Discharge: 2021-08-30 | Disposition: A | Discharge status: Home / Self Care | Payer: 59 | Attending: Emergency Medicine | Admitting: Emergency Medicine

## 2021-08-30 DIAGNOSIS — I441 Atrioventricular block, second degree: Secondary | ICD-10-CM | POA: Insufficient documentation

## 2021-08-30 DIAGNOSIS — R079 Chest pain, unspecified: Secondary | ICD-10-CM | POA: Diagnosis present

## 2021-08-30 DIAGNOSIS — R42 Dizziness and giddiness: Secondary | ICD-10-CM | POA: Insufficient documentation

## 2021-08-30 LAB — CBC WITH DIFFERENTIAL/PLATELET
Abs Immature Granulocytes: 0.02 10*3/uL (ref 0.00–0.07)
Basophils Absolute: 0 10*3/uL (ref 0.0–0.1)
Basophils Relative: 0 %
Eosinophils Absolute: 0 10*3/uL (ref 0.0–0.5)
Eosinophils Relative: 0 %
HCT: 45.4 % (ref 39.0–52.0)
Hemoglobin: 14.5 g/dL (ref 13.0–17.0)
Immature Granulocytes: 0 %
Lymphocytes Relative: 31 %
Lymphs Abs: 1.6 10*3/uL (ref 0.7–4.0)
MCH: 26.5 pg (ref 26.0–34.0)
MCHC: 31.9 g/dL (ref 30.0–36.0)
MCV: 83 fL (ref 80.0–100.0)
Monocytes Absolute: 0.3 10*3/uL (ref 0.1–1.0)
Monocytes Relative: 6 %
Neutro Abs: 3.3 10*3/uL (ref 1.7–7.7)
Neutrophils Relative %: 63 %
Platelets: 278 10*3/uL (ref 150–400)
RBC: 5.47 MIL/uL (ref 4.22–5.81)
RDW: 12.2 % (ref 11.5–15.5)
WBC: 5.3 10*3/uL (ref 4.0–10.5)
nRBC: 0 % (ref 0.0–0.2)

## 2021-08-30 LAB — MAGNESIUM: Magnesium: 1.9 mg/dL (ref 1.7–2.4)

## 2021-08-30 LAB — BASIC METABOLIC PANEL
Anion gap: 7 (ref 5–15)
BUN: 10 mg/dL (ref 6–20)
CO2: 23 mmol/L (ref 22–32)
Calcium: 9 mg/dL (ref 8.9–10.3)
Chloride: 106 mmol/L (ref 98–111)
Creatinine, Ser: 1.04 mg/dL (ref 0.61–1.24)
GFR, Estimated: >60 mL/min (ref >60)
Glucose, Bld: 116 mg/dL — ABNORMAL HIGH (ref 70–99)
Potassium: 3.5 mmol/L (ref 3.5–5.1)
Sodium: 136 mmol/L (ref 135–145)

## 2021-08-30 LAB — TROPONIN I (HIGH SENSITIVITY)
Troponin I (High Sensitivity): 3 ng/L (ref <18)
Troponin I (High Sensitivity): 2 ng/L (ref <18)

## 2021-08-30 NOTE — ED Triage Notes (Signed)
Pt. Stated, I started having chest pain with lightheadedness around 10 or 11 last night.I had this before and they said I was ok so it might be a mental thing or I need to eat better.

## 2021-08-30 NOTE — ED Provider Notes (Signed)
[facility] EMERGENCY DEPARTMENT Provider Note   CSN: [id number] Arrival date & time: 08/30/21  0932     History  Chief Complaint  Patient presents with   Chest Pain    ADORIAN GWYNNE is a 36 y.o. male is a 36 y.o. male presenting to emergency department with complaint of dizziness and chest pain.  The patient reports that yesterday after leaving a restaurant, walking in the parking lot, he abruptly began having a sharp pain in the middle of his chest, then felt like his heart was racing.  He said this lasted a few minutes and seem to get better on its own.  He went home with a partner, and was laying in bed, and then began to develop vertigo, which also resolved overnight.  This morning he woke up feeling well, then subsequently had again an episode of palpitations, without vertigo.  He currently has a mild chest discomfort sensation, which is a sharp pain located near his epigastrium, does not radiate or travel anywhere.  This is the same pain he had yesterday.  He has not had the symptoms before.  Seen by cardiology in 2021 for "arrhythmia" issues.  Per medical record external review, echo in Dec 2021 with EF 60-65%, normal LV and RV function, telemetry monitor in Dec 2021 noting first degree AV block, 5 pauses (sinus bradycardia with second-degree type 1 AV block) and one possible episode of high-grade AV block  HPI     Home Medications Prior to Admission medications   Medication Sig Start Date End Date Taking? Authorizing Provider  pantoprazole (PROTONIX) 40 MG tablet Take 1 tablet (40 mg total) by mouth daily. 01/27/20   Janeece Agee, NP  SUMAtriptan (IMITREX) 50 MG tablet Take 1 tablet (50 mg total) by mouth every 2 (two) hours as needed for migraine. May repeat in 2 hours if headache persists or recurs. Patient not taking: Reported on 06/23/2019 05/01/19 06/23/19  Frederica Kuster, MD      Allergies    Patient has no known allergies.    Review of  Systems   Review of Systems  Physical Exam Updated Vital Signs BP 121/70   Pulse (!) 50   Temp 98.2 F (36.8 C) (Oral)   Resp 14   Ht 6' (1.829 m)   Wt 84 kg   SpO2 96%   BMI 25.12 kg/m  Physical Exam Constitutional:      General: He is not in acute distress. HENT:     Head: Normocephalic and atraumatic.  Eyes:     Conjunctiva/sclera: Conjunctivae normal.     Pupils: Pupils are equal, round, and reactive to light.  Cardiovascular:     Rate and Rhythm: Normal rate and regular rhythm.  Pulmonary:     Effort: Pulmonary effort is normal. No respiratory distress.  Abdominal:     General: There is no distension.     Tenderness: There is no abdominal tenderness.  Skin:    General: Skin is warm and dry.  Neurological:     General: No focal deficit present.     Mental Status: He is alert. Mental status is at baseline.  Psychiatric:        Mood and Affect: Mood normal.        Behavior: Behavior normal.    ED Results / Procedures / Treatments   Labs (all labs ordered are listed, but only abnormal results are displayed) Labs Reviewed  BASIC METABOLIC PANEL - Abnormal; Notable for the following  components:      Result Value   Glucose, Bld 116 (*)    All other components within normal limits  CBC WITH DIFFERENTIAL/PLATELET  MAGNESIUM  TROPONIN I (HIGH SENSITIVITY)  TROPONIN I (HIGH SENSITIVITY)    EKG EKG Interpretation  Date/Time:  Tuesday Aug 30 2021 09:50:54 EDT Ventricular Rate:  44 PR Interval:    QRS Duration: 80 QT Interval:  458 QTC Calculation: 392 R Axis:   87 Text Interpretation: Sinus bradycardia Second deg AVB, Mobitz I (Wenckebach) Biatrial enlargement Probable anteroseptal infarct, old Confirmed by Alvester Chourifan, Dallin Mccorkel [phone]([id number]) on 08/30/2021 10:33:03 AM  Radiology DG Chest 2 View  Result Date: 08/30/2021 CLINICAL DATA:  Chest pain EXAM: CHEST - 2 VIEW COMPARISON:  None Available. FINDINGS: Normal heart size. Normal mediastinal contour. No pneumothorax.  No pleural effusion. Lungs appear clear, with no acute consolidative airspace disease and no pulmonary edema. IMPRESSION: No active cardiopulmonary disease. Electronically Signed   By: Delbert PhenixJason A Poff M.D.   On: 08/30/2021 09:17    Procedures Procedures    Medications Ordered in ED Medications - No data to display  ED Course/ Medical Decision Making/ A&P Clinical Course as of 08/30/21 1317  Tue Aug 30, 2021  0939 Note patient initially triaged incorrectly under the name of his identical twin brother, now changed appropriately  [MT]  1010 Ecg repeat shows mobitz type 1 heart block [MT]  1028 I spoke to Dr Rosemary HolmsPatwardhan from [facility] who reviewed outpt records, and feels the current ECG and presentation are consistent with mobitz type 1, not high-grade heart block.  There was no syncope that occurred last night.  He was recommended to continue observing the patient while we await 2 troponins in the ED, reviewed telemetry, if no prolonged pauses, the patient likely can follow-up as an outpatient. There would be no indication for pacemaker placement at this time without high-grade heart block. [MT]  1254 Patient remains asymptomatic, telemetry reviewed showing essentially frequent Mobitz type I pattern, intermittently going back into sinus rhythm.  No extended pauses noted.  Patient will follow-up with cardiology, their office will reach out to him, explained this.  He verbalized understanding [MT]    Clinical Course User Index [MT] Dellene Mcgroarty, Kermit BaloMatthew J, MD                           Medical Decision Making Amount and/or Complexity of Data Reviewed Labs: ordered. ECG/medicine tests: ordered.   This patient presents to the ED with concern for chest pain. This involves an extensive number of treatment options, and is a complaint that carries with it a high risk of complications and morbidity.  The differential diagnosis includes arrhythmia versus pneumonia versus pneumothorax versus  musculoskeletal pain versus anxiety attack versus other  External records from outside source obtained and reviewed including outpatient cardiology evaluation and telemetry report as noted in history above  I ordered and personally interpreted labs.  The pertinent results include: Delta troponin flat, electrolytes within normal limits, CBC unremarkable  I ordered imaging studies including x-ray of the chest I independently visualized and interpreted imaging which showed no focal finding I agree with the radiologist interpretation  Please note that initial ecg and xray obtained erroneously under patient's identical twin brother's name.  Xray imaging needs to be transferred to this patient's account.    The patient was maintained on a cardiac monitor.  I personally viewed and interpreted the cardiac monitored which showed an underlying rhythm of:  Mobitz type I with intermittent sinus rhythm  Per my interpretation the patient's ECG shows Mobitz type I heart block  Test Considered: Lower suspicion for acute PE, do not feel CT PE scan was indicated at this time  Case discussed in consultation with cardiologist by phone as noted above.  They recommend close outpatient follow-up in the office and we will help to arrange this  After the interventions noted above, I reevaluated the patient and found that they have: improved  He had minimal discomfort and was otherwise asymptomatic and his stay in the ED  Dispostion:  After consideration of the diagnostic results and the patients response to treatment, I feel that the patent would benefit from outpatient cardiology follow-up.         Final Clinical Impression(s) / ED Diagnoses Final diagnoses:  Chest pain, unspecified type  Mobitz type 1 second degree atrioventricular block    Rx / DC Orders ED Discharge Orders     None         Terald Sleeper, MD 08/30/21 1317

## 2021-08-30 NOTE — Progress Notes (Signed)
Dizziness, no syncope on presentation. EKG with sinus rhythm, second degree type 1 AV block. If trops negative and no high grade AV block on telemetry, can be discharged. Outpatient f/u Dr. Terri Skains on 6/9 1:30 PM.   Nigel Mormon, MD Pager: [phone] Office: [phone]

## 2021-08-30 NOTE — ED Triage Notes (Signed)
Pt reports L sided chest starting last night. Pt states pain occurred suddenly and intermittent. Denies SOB or NV. Pt reports dizziness . GCS 15

## 2021-08-30 NOTE — Discharge Instructions (Addendum)
We talked about the arrhythmia for your heart.  You have a Mobitz type I heart block.  I included information for you to read over and the attached instructions.  This is generally a benign condition, not a life-threatening problem.  However, it does need the attention of the cardiologist.  Their office will reach out to you to set up a follow-up appointment.  If you do not hear from their office by 3 PM tomorrow, please call the number above to ask for a follow-up appointment.  In the ER, we did not see signs of an infection, heart attack, or other life-threatening condition today.  If you ever lose consciousness, become extremely lightheaded, difficulty breathing, please call 911 and come back to the ER.

## 2021-09-09 ENCOUNTER — Ambulatory Visit: Payer: 59 | Admitting: Cardiology

## 2021-12-09 IMAGING — CR DG CHEST 2V
2 series · 2 of 2 positions shown · non-contrast
Comparison: None.

CLINICAL DATA: Tachycardia, dyspnea

EXAM:
CHEST - 2 VIEW

[chest pa]
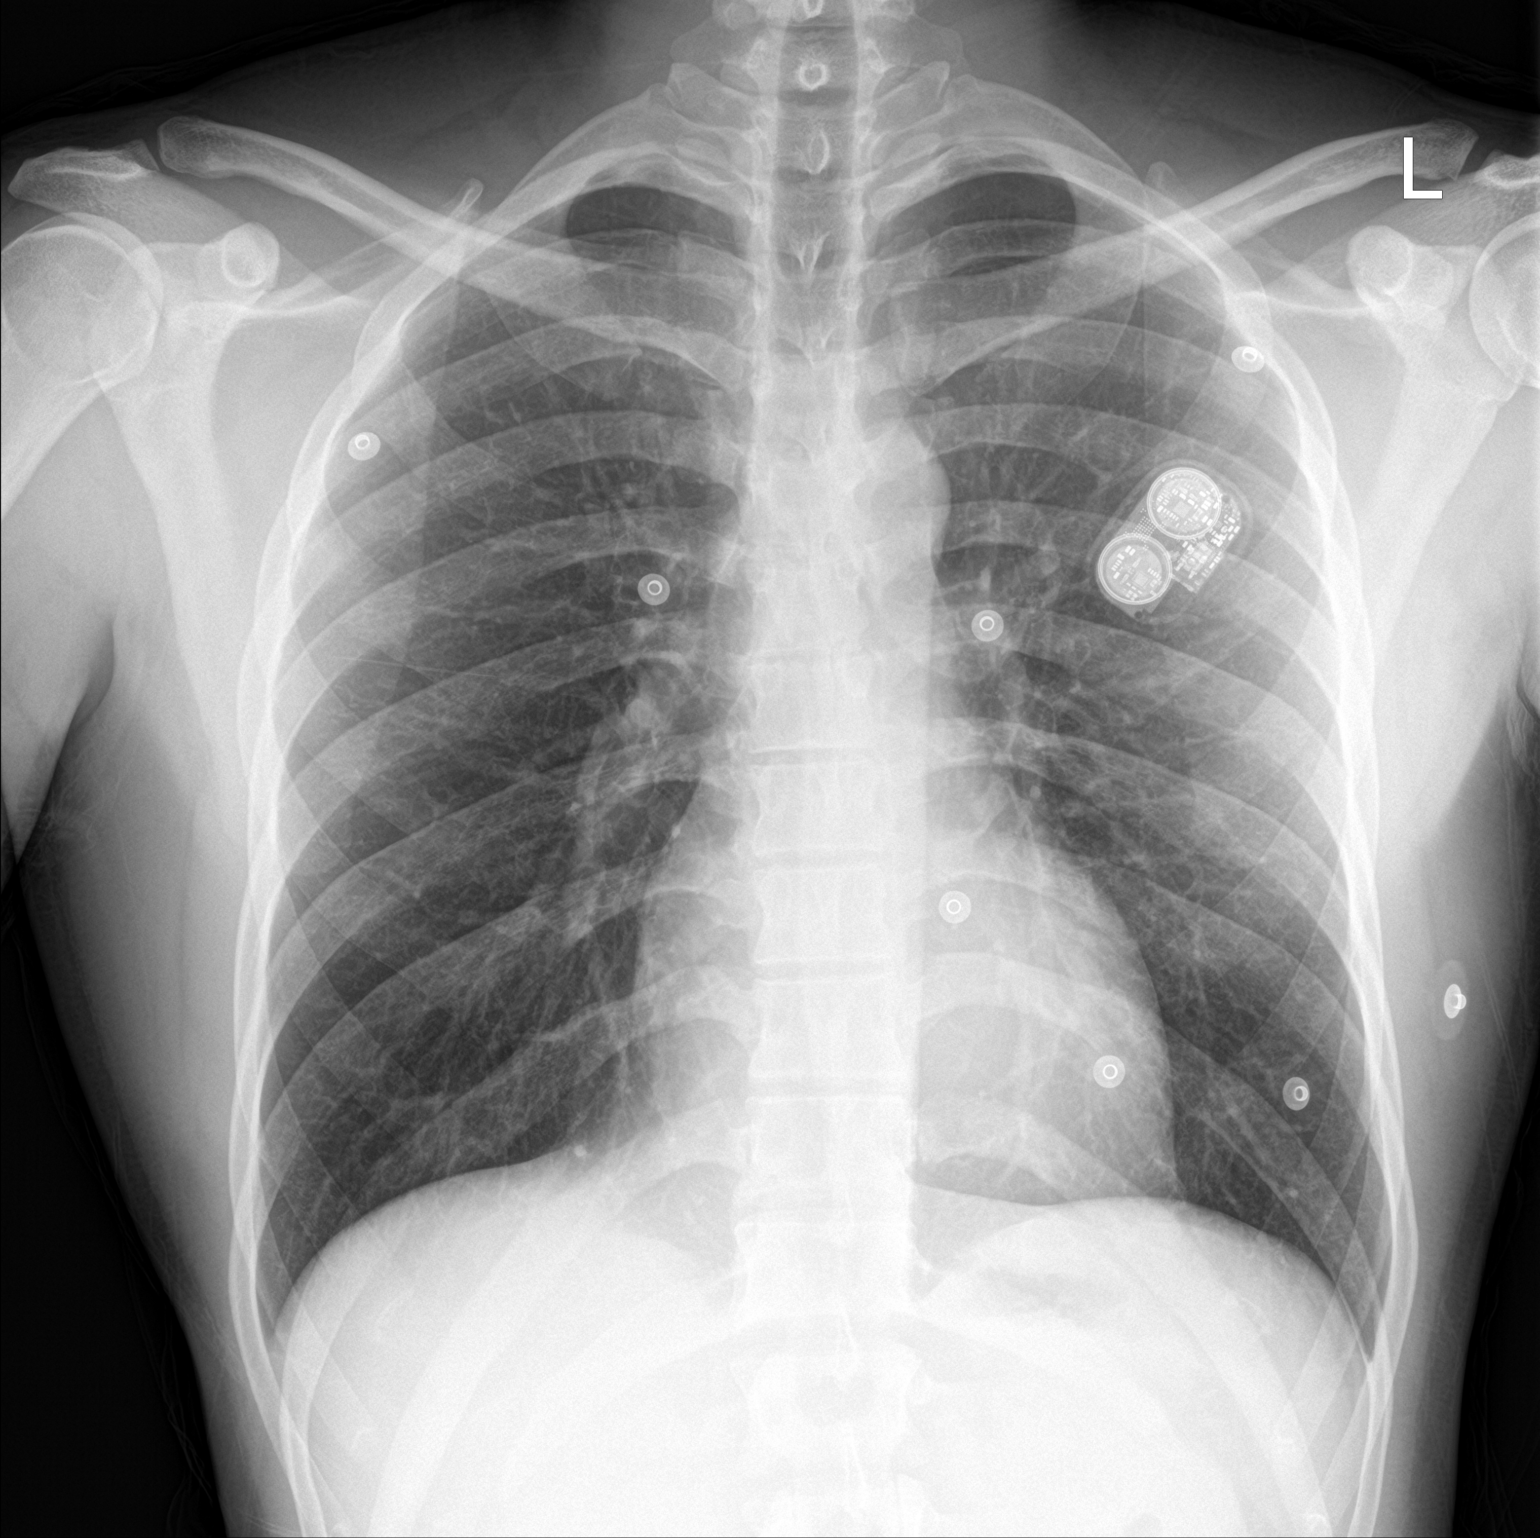

[chest lat]
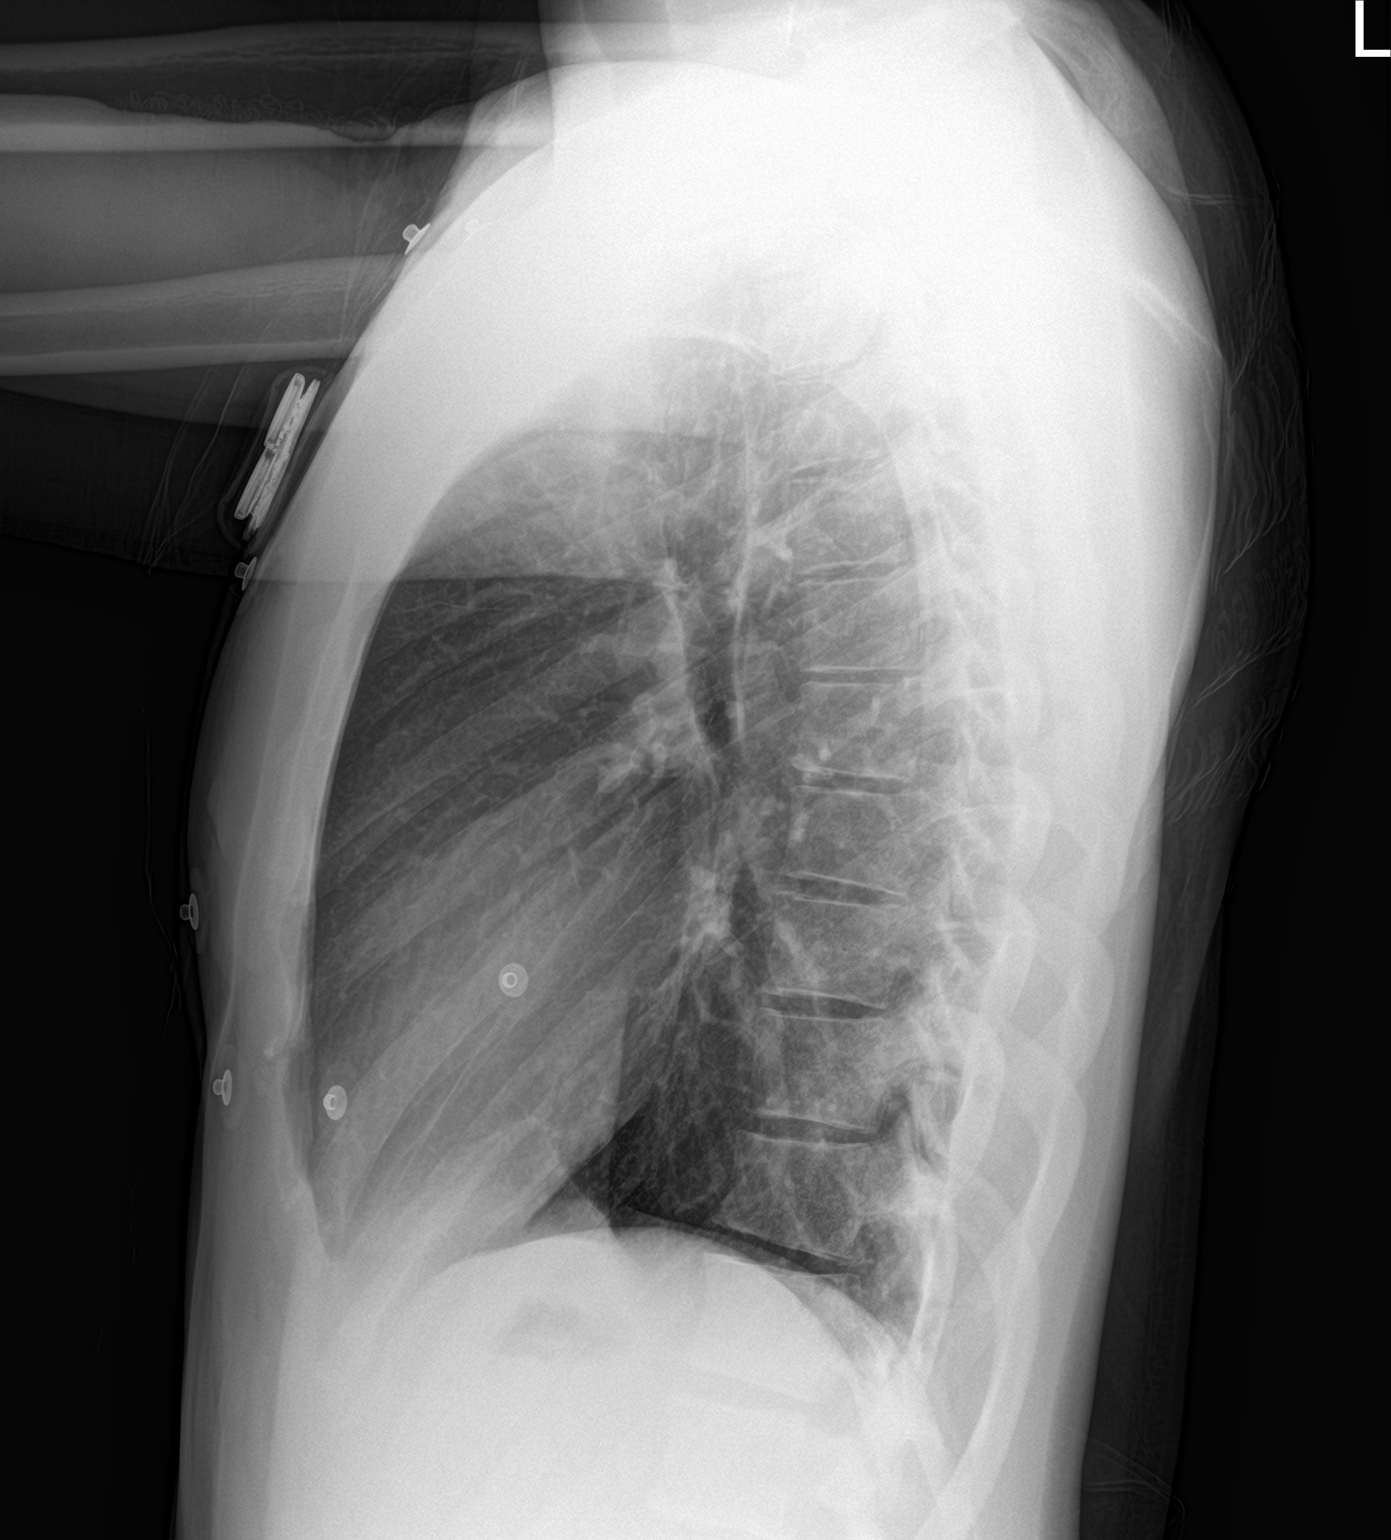

[2 of 2 positions shown; findings below may reference images not displayed]

FINDINGS: Lungs are well expanded, symmetric, and clear. No pneumothorax or
pleural effusion. Cardiac size within normal limits. Pulmonary
vascularity is normal. Osseous structures are age-appropriate. No
acute bone abnormality.
IMPRESSION: No active cardiopulmonary disease.

## 2022-12-11 ENCOUNTER — Encounter (HOSPITAL_COMMUNITY): Payer: Self-pay

## 2022-12-11 ENCOUNTER — Ambulatory Visit (HOSPITAL_COMMUNITY)
Admission: EM | Admit: 2022-12-11 | Discharge: 2022-12-11 | Disposition: A | Discharge status: Home / Self Care | Payer: 59 | Attending: Family Medicine | Admitting: Family Medicine

## 2022-12-11 DIAGNOSIS — G8929 Other chronic pain: Secondary | ICD-10-CM

## 2022-12-11 MED ORDER — PREDNISONE 50 MG PO TABS: ORAL_TABLET | ORAL | 0 refills | Status: DC

## 2022-12-11 NOTE — ED Triage Notes (Signed)
Patient here today with c/o right shoulder pain for years now. He has had injections in his shoulder with no improvement. He works at [employer] and has to lift a lot and has noticed that his pain has been worsening. Increased pain with ROM. He also has some popping and clicking. He had to call out of work today.

## 2022-12-13 NOTE — ED Provider Notes (Addendum)
[facility]   [id number] 12/11/22 Arrival Time: 1127  ASSESSMENT & PLAN:  1. Chronic right shoulder pain    Acute on chronic exacerbation.  Trial of: Discharge Medication List as of 12/11/2022  1:58 PM     START taking these medications   Details  predniSONE (DELTASONE) 50 MG tablet Take one tablet by mouth for 5 days., Normal       Given chronic nature of pain, feel he would benefit from orthopaedic evaluation. Work/school excuse note: provided with recommended light duty. Recommend:  Follow-up Information     Schedule an appointment as soon as possible for a visit  with [facility]   Specialty: Orthopedic Surgery Contact information: [facility] [address] [place] [address] [phone]                Reviewed expectations re: course of current medical issues. Questions answered. Outlined signs and symptoms indicating need for more acute intervention. Patient verbalized understanding. After Visit Summary given.  SUBJECTIVE: History from: patient. Jeffrey Parker is a 37 y.o. male who reports right shoulder pain for years now. He has had injections in his shoulder with no improvement. He works at [employer] and has to lift a lot and has noticed that his pain has been worsening. Increased pain with ROM. He also has some popping and clicking. He had to call out of work today.  Works at [employer]; does heavy lifting. No extremity sensation changes or weakness.   History reviewed. No pertinent surgical history.    OBJECTIVE:  Vitals:   12/11/22 1309 12/11/22 1320  BP: 125/76 133/79  Pulse: 63 (!) 55  Resp: 16 16  Temp: 97.6 F (36.4 C) 97.6 F (36.4 C)  TempSrc: Oral Oral  SpO2: 98% 99%  Weight:  81.6 kg  Height:  6\' 4"  (1.93 m)    General appearance: alert; no distress HEENT: [place]; AT Neck: supple with FROM Resp: unlabored respirations Extremities: RUE: warm with well perfused  appearance; poorly localized moderate tenderness over right anterior and superior shoulder; without gross deformities; swelling: none; bruising: none; shoulder ROM: normal, with discomfort CV: brisk extremity capillary refill of RUE; 2+ radial pulse of RUE. Skin: warm and dry; no visible rashes Neurologic: gait normal; normal sensation and strength of RUE Psychological: alert and cooperative; normal mood and affect     No Known Allergies  History reviewed. No pertinent past medical history. Social History   Socioeconomic History   Marital status: Significant Other    Spouse name: Not on file   Number of children: 5   Years of education: Not on file   Highest education level: Not on file  Occupational History   Not on file  Tobacco Use   Smoking status: Never   Smokeless tobacco: Never  Vaping Use   Vaping status: Never Used  Substance and Sexual Activity   Alcohol use: Never   Drug use: Never   Sexual activity: Not Currently  Other Topics Concern   Not on file  Social History Narrative   Not on file   Social Determinants of Health   Financial Resource Strain: Not on file  Food Insecurity: Not on file  Transportation Needs: Not on file  Physical Activity: Not on file  Stress: Not on file  Social Connections: Not on file   Family History  Problem Relation Age of Onset   Healthy Mother    Healthy Father    History reviewed. No pertinent  surgical history.     Mardella Layman, MD 12/13/22 1322    Mardella Layman, MD 12/13/22 1322

## 2024-03-12 ENCOUNTER — Encounter (HOSPITAL_COMMUNITY): Payer: Self-pay

## 2024-03-12 ENCOUNTER — Ambulatory Visit (HOSPITAL_COMMUNITY)
Admission: EM | Admit: 2024-03-12 | Discharge: 2024-03-12 | Disposition: A | Discharge status: Home / Self Care | Attending: Nurse Practitioner | Admitting: Nurse Practitioner

## 2024-03-12 DIAGNOSIS — J069 Acute upper respiratory infection, unspecified: Secondary | ICD-10-CM

## 2024-03-12 LAB — POC COVID19/FLU A&B COMBO
Covid Antigen, POC: NEGATIVE
Influenza A Antigen, POC: NEGATIVE
Influenza B Antigen, POC: NEGATIVE

## 2024-03-12 MED ORDER — BENZONATATE 100 MG PO CAPS: 100.0000 mg | ORAL_CAPSULE | Freq: Three times a day (TID) | ORAL | 0 refills | Status: AC | PRN

## 2024-03-12 MED ORDER — FLUTICASONE PROPIONATE 50 MCG/ACT NA SUSP: 1.0000 | Freq: Every day | NASAL | 0 refills | Status: AC

## 2024-03-12 NOTE — Discharge Instructions (Signed)
 You have a viral upper respiratory infection.  Symptoms should improve over the next week to 10 days.  If you develop chest pain or shortness of breath, go to the emergency room.  COVID-19 and influenza testing is negative today.  Some things that can make you feel better are: - Increased rest - Increasing fluid with water/sugar free electrolytes - Acetaminophen and ibuprofen as needed for fever/pain - Salt water gargling, chloraseptic spray and throat lozenges - OTC guaifenesin (Mucinex) 600 mg twice daily - Saline sinus flushes or a neti pot - Humidifying the air  -Flonase nasal spray for postnasal drainage -Tessalon Perles every 8 hours as needed for dry cough

## 2024-03-12 NOTE — ED Triage Notes (Signed)
 Patient c/o fever, sore throat, and a non productive cough x 2 days.  Patient has been taking Dayquil/Nyquil for his symptoms.

## 2024-03-12 NOTE — ED Provider Notes (Signed)
 [facility]    CSN: [id number] Arrival date & time: 03/12/24  9065      History   Chief Complaint Chief Complaint  Patient presents with   Fever   Sore Throat   Cough    HPI Jeffrey Parker is a 38 y.o. male.   Patient presents today with 2-day history of tactile fever, body aches, sweating at night, runny stuffy nose, sore throat, headache, decreased appetite, fatigue, and cough that has now improved.  He denies chest pain or shortness of breath, ear pain, abdominal pain, nausea/vomiting, and diarrhea.  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  Has been taking DayQuil and NyQuil which does help with symptoms.    History reviewed. No pertinent past medical history.  Patient Active Problem List   Diagnosis Date Noted   Bradycardia    Cardiac murmur    Abnormal heart sounds 01/27/2020    History reviewed. No pertinent surgical history.     Home Medications    Prior to Admission medications   Medication Sig Start Date End Date Taking? Authorizing Provider  benzonatate (TESSALON) 100 MG capsule Take 1 capsule (100 mg total) by mouth 3 (three) times daily as needed for cough. Do not take with alcohol or while operating or driving heavy machinery 87/89/74  Yes Chandra Raisin A, NP  fluticasone (FLONASE) 50 MCG/ACT nasal spray Place 1 spray into both nostrils daily. 03/12/24  Yes Chandra Raisin LABOR, NP  SUMAtriptan  (IMITREX ) 50 MG tablet Take 1 tablet (50 mg total) by mouth every 2 (two) hours as needed for migraine. May repeat in 2 hours if headache persists or recurs. Patient not taking: Reported on 06/23/2019 05/01/19 06/23/19  Cleotilde Garnette CHRISTELLA, MD    Family History Family History  Problem Relation Age of Onset   Healthy Mother    Healthy Father     Social History Social History   Tobacco Use   Smoking status: Never   Smokeless tobacco: Never  Vaping Use   Vaping status: Never Used  Substance Use  Topics   Alcohol use: Never   Drug use: Never     Allergies   Patient has no known allergies.   Review of Systems Review of Systems Per HPI  Physical Exam Triage Vital Signs ED Triage Vitals [03/12/24 1045]  Encounter Vitals Group     BP 130/87     Girls Systolic BP Percentile      Girls Diastolic BP Percentile      Boys Systolic BP Percentile      Boys Diastolic BP Percentile      Pulse Rate 68     Resp 16     Temp 98.9 F (37.2 C)     Temp Source Oral     SpO2 95 %     Weight      Height      Head Circumference      Peak Flow      Pain Score 8     Pain Loc      Pain Education      Exclude from Growth Chart    No data found.  Updated Vital Signs BP 130/87 (BP Location: Left Arm)   Pulse 68   Temp 98.9 F (37.2 C) (Oral)   Resp 16   SpO2 95%   Visual Acuity Right Eye Distance:   Left Eye Distance:   Bilateral Distance:    Right Eye Near:  Left Eye Near:    Bilateral Near:     Physical Exam Vitals and nursing note reviewed.  Constitutional:      General: He is not in acute distress.    Appearance: Normal appearance. He is not ill-appearing or toxic-appearing.  HENT:     Head: Normocephalic and atraumatic.     Right Ear: Tympanic membrane, ear canal and external ear normal.     Left Ear: Tympanic membrane, ear canal and external ear normal.     Nose: No congestion or rhinorrhea.     Mouth/Throat:     Mouth: Mucous membranes are moist.     Pharynx: Oropharynx is clear. Postnasal drip present. No oropharyngeal exudate or posterior oropharyngeal erythema.  Eyes:     General: No scleral icterus.    Extraocular Movements: Extraocular movements intact.  Cardiovascular:     Rate and Rhythm: Normal rate and regular rhythm.  Pulmonary:     Effort: Pulmonary effort is normal. No respiratory distress.     Breath sounds: Normal breath sounds. No wheezing, rhonchi or rales.  Musculoskeletal:     Cervical back: Normal range of motion and neck supple.   Lymphadenopathy:     Cervical: No cervical adenopathy.  Skin:    General: Skin is warm and dry.     Coloration: Skin is not jaundiced or pale.     Findings: No erythema or rash.  Neurological:     Mental Status: He is alert and oriented to person, place, and time.  Psychiatric:        Behavior: Behavior is cooperative.      UC Treatments / Results  Labs (all labs ordered are listed, but only abnormal results are displayed) Labs Reviewed  POC COVID19/FLU A&B COMBO    EKG   Radiology No results found.  Procedures Procedures (including critical care time)  Medications Ordered in UC Medications - No data to display  Initial Impression / Assessment and Plan / UC Course  I have reviewed the triage vital signs and the nursing notes.  Pertinent labs & imaging results that were available during my care of the patient were reviewed by me and considered in my medical decision making (see chart for details).   Patient is a pleasant, well-appearing 38 year old male presenting today for viral symptoms.  Vital signs are stable and examination is reassuring.  COVID-19 and influenza testing is negative today.  Supportive care discussed with patient, start Flonase nasal spray as needed for postnasal drainage and cough suppressant medication if cough recurs.  Return and ER precautions discussed.  Work excuse provided.  The patient was given the opportunity to ask questions.  All questions answered to their satisfaction.  The patient is in agreement to this plan.   Final Clinical Impressions(s) / UC Diagnoses   Final diagnoses:  Viral URI with cough     Discharge Instructions      You have a viral upper respiratory infection.  Symptoms should improve over the next week to 10 days.  If you develop chest pain or shortness of breath, go to the emergency room.  COVID-19 and influenza testing is negative today.  Some things that can make you feel better are: - Increased rest -  Increasing fluid with water/sugar free electrolytes - Acetaminophen and ibuprofen as needed for fever/pain - Salt water gargling, chloraseptic spray and throat lozenges - OTC guaifenesin (Mucinex) 600 mg twice daily - Saline sinus flushes or a neti pot - Humidifying the air  -Flonase nasal  spray for postnasal drainage -Tessalon Perles every 8 hours as needed for dry cough      ED Prescriptions     Medication Sig Dispense Auth. Provider   fluticasone (FLONASE) 50 MCG/ACT nasal spray Place 1 spray into both nostrils daily. 16 g Chandra Raisin A, NP   benzonatate (TESSALON) 100 MG capsule Take 1 capsule (100 mg total) by mouth 3 (three) times daily as needed for cough. Do not take with alcohol or while operating or driving heavy machinery 30 capsule Chandra Raisin LABOR, NP      PDMP not reviewed this encounter.   Chandra Raisin LABOR, NP 03/12/24 1130
